# Patient Record
Sex: Female | Born: 1995 | Race: Black or African American | Hispanic: No | Marital: Single | Smoking: Current some day smoker
Health system: Southern US, Community
[De-identification: ages and names within clinical notes are randomized; demographics above are authoritative.]

## PROBLEM LIST (undated history)

## (undated) ENCOUNTER — Inpatient Hospital Stay (HOSPITAL_COMMUNITY): Payer: Self-pay

## (undated) ENCOUNTER — Inpatient Hospital Stay (HOSPITAL_COMMUNITY): Payer: Medicaid Other | Admitting: Obstetrics & Gynecology

## (undated) DIAGNOSIS — I10 Essential (primary) hypertension: Secondary | ICD-10-CM

## (undated) DIAGNOSIS — N189 Chronic kidney disease, unspecified: Secondary | ICD-10-CM

## (undated) HISTORY — PX: NEPHRECTOMY: SHX65

## (undated) HISTORY — PX: WISDOM TOOTH EXTRACTION: SHX21

---
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
## 2015-07-04 DIAGNOSIS — Z6830 Body mass index (BMI) 30.0-30.9, adult: Secondary | ICD-10-CM | POA: Insufficient documentation

## 2018-08-17 ENCOUNTER — Inpatient Hospital Stay (HOSPITAL_COMMUNITY): Payer: Medicaid Other

## 2018-08-17 ENCOUNTER — Other Ambulatory Visit: Payer: Self-pay

## 2018-08-17 ENCOUNTER — Inpatient Hospital Stay (HOSPITAL_COMMUNITY)
Admission: AD | Admit: 2018-08-17 | Discharge: 2018-08-17 | Disposition: A | Discharge status: Home / Self Care | Payer: Medicaid Other | Attending: Obstetrics & Gynecology | Admitting: Obstetrics & Gynecology

## 2018-08-17 ENCOUNTER — Encounter (HOSPITAL_COMMUNITY): Payer: Self-pay | Admitting: *Deleted

## 2018-08-17 DIAGNOSIS — O26831 Pregnancy related renal disease, first trimester: Secondary | ICD-10-CM | POA: Insufficient documentation

## 2018-08-17 DIAGNOSIS — O020 Blighted ovum and nonhydatidiform mole: Secondary | ICD-10-CM

## 2018-08-17 DIAGNOSIS — N189 Chronic kidney disease, unspecified: Secondary | ICD-10-CM | POA: Insufficient documentation

## 2018-08-17 DIAGNOSIS — Z3A01 Less than 8 weeks gestation of pregnancy: Secondary | ICD-10-CM | POA: Insufficient documentation

## 2018-08-17 DIAGNOSIS — O9989 Other specified diseases and conditions complicating pregnancy, childbirth and the puerperium: Secondary | ICD-10-CM | POA: Diagnosis not present

## 2018-08-17 DIAGNOSIS — I129 Hypertensive chronic kidney disease with stage 1 through stage 4 chronic kidney disease, or unspecified chronic kidney disease: Secondary | ICD-10-CM | POA: Diagnosis not present

## 2018-08-17 DIAGNOSIS — R1030 Lower abdominal pain, unspecified: Secondary | ICD-10-CM | POA: Diagnosis present

## 2018-08-17 DIAGNOSIS — Z9889 Other specified postprocedural states: Secondary | ICD-10-CM | POA: Insufficient documentation

## 2018-08-17 DIAGNOSIS — F17210 Nicotine dependence, cigarettes, uncomplicated: Secondary | ICD-10-CM | POA: Insufficient documentation

## 2018-08-17 DIAGNOSIS — R109 Unspecified abdominal pain: Secondary | ICD-10-CM | POA: Diagnosis not present

## 2018-08-17 DIAGNOSIS — Z905 Acquired absence of kidney: Secondary | ICD-10-CM | POA: Insufficient documentation

## 2018-08-17 DIAGNOSIS — Z8249 Family history of ischemic heart disease and other diseases of the circulatory system: Secondary | ICD-10-CM | POA: Insufficient documentation

## 2018-08-17 DIAGNOSIS — O26891 Other specified pregnancy related conditions, first trimester: Secondary | ICD-10-CM

## 2018-08-17 DIAGNOSIS — Z886 Allergy status to analgesic agent status: Secondary | ICD-10-CM | POA: Insufficient documentation

## 2018-08-17 DIAGNOSIS — O99331 Smoking (tobacco) complicating pregnancy, first trimester: Secondary | ICD-10-CM | POA: Diagnosis not present

## 2018-08-17 DIAGNOSIS — O219 Vomiting of pregnancy, unspecified: Secondary | ICD-10-CM | POA: Diagnosis not present

## 2018-08-17 HISTORY — DX: Chronic kidney disease, unspecified: N18.9

## 2018-08-17 HISTORY — DX: Essential (primary) hypertension: I10

## 2018-08-17 LAB — URINALYSIS, ROUTINE W REFLEX MICROSCOPIC
Bilirubin Urine: NEGATIVE
Glucose, UA: NEGATIVE mg/dL
Hgb urine dipstick: NEGATIVE
Ketones, ur: NEGATIVE mg/dL
Leukocytes, UA: NEGATIVE
Nitrite: NEGATIVE
Protein, ur: NEGATIVE mg/dL
Specific Gravity, Urine: 1.018 (ref 1.005–1.030)
pH: 6 (ref 5.0–8.0)

## 2018-08-17 LAB — CBC WITH DIFFERENTIAL/PLATELET
Basophils Absolute: 0 10*3/uL (ref 0.0–0.1)
Basophils Relative: 0 %
EOS ABS: 0.1 10*3/uL (ref 0.0–0.5)
Eosinophils Relative: 1 %
HCT: 38.4 % (ref 36.0–46.0)
Hemoglobin: 12.7 g/dL (ref 12.0–15.0)
LYMPHS ABS: 2.8 10*3/uL (ref 0.7–4.0)
Lymphocytes Relative: 29 %
MCH: 30.5 pg (ref 26.0–34.0)
MCHC: 33.1 g/dL (ref 30.0–36.0)
MCV: 92.1 fL (ref 80.0–100.0)
Monocytes Absolute: 0.5 10*3/uL (ref 0.1–1.0)
Monocytes Relative: 5 %
Neutro Abs: 6.4 10*3/uL (ref 1.7–7.7)
Neutrophils Relative %: 65 %
Platelets: 189 10*3/uL (ref 150–400)
RBC: 4.17 MIL/uL (ref 3.87–5.11)
RDW: 13.2 % (ref 11.5–15.5)
WBC: 9.8 10*3/uL (ref 4.0–10.5)
nRBC: 0 % (ref 0.0–0.2)

## 2018-08-17 LAB — TYPE AND SCREEN
ABO/RH(D): O POS
Antibody Screen: NEGATIVE

## 2018-08-17 LAB — ABO/RH: ABO/RH(D): O POS

## 2018-08-17 LAB — POCT PREGNANCY, URINE: PREG TEST UR: POSITIVE — AB

## 2018-08-17 LAB — HCG, QUANTITATIVE, PREGNANCY: hCG, Beta Chain, Quant, S: 4525 m[IU]/mL — ABNORMAL HIGH (ref <5)

## 2018-08-17 NOTE — MAU Provider Note (Signed)
History     CSN: [id number]  Arrival date and time: 08/17/18 1518   First Provider Initiated Contact with Patient 08/17/18 1728      Chief Complaint  Patient presents with  . Abdominal Pain  . Emesis  . Possible Pregnancy   Kelli Scott is a 22 y.o. G1P0 at [redacted]w[redacted]d by LMP who presents to MAU with complaints of abdominal pain and vomiting. She reports abdominal pain has been occurring for the past 1-2 days, describes the abdominal pain as lower abdominal cramping, rates pain 2/10- has not taken any medication for abdominal pain- "does not like taking medicine". She denies vaginal bleeding or vaginal discharge. She reports 1 occurrence of emesis this afternoon but denies continued nausea.    OB History    Gravida  1   Para      Term      Preterm      AB      Living        SAB      TAB      Ectopic      Multiple      Live Births              Past Medical History:  Diagnosis Date  . Chronic kidney disease   . Hypertension     Past Surgical History:  Procedure Laterality Date  . NEPHRECTOMY Left   . WISDOM TOOTH EXTRACTION Bilateral     Family History  Problem Relation Age of Onset  . Hypertension Father   . Hypertension Brother   . Stroke Brother     Social History   Tobacco Use  . Smoking status: Current Every Day Smoker    Types: Cigarettes  . Smokeless tobacco: Never Used  Substance Use Topics  . Alcohol use: Not Currently  . Drug use: Not Currently    Allergies:  Allergies  Allergen Reactions  . Ibuprofen     No medications prior to admission.    Review of Systems  Constitutional: Negative.   Respiratory: Negative.   Cardiovascular: Negative.   Gastrointestinal: Positive for abdominal pain and vomiting. Negative for constipation, diarrhea and nausea.  Genitourinary: Negative.   Neurological: Negative.    Physical Exam   Patient Vitals for the past 24 hrs:  BP Temp Temp src Pulse Resp SpO2 Weight  08/17/18 1935 (!) 139/95  - - - - - -  08/17/18 1543 (!) 145/93 98.8 F (37.1 C) Oral 89 17 100 % 73.7 kg   Physical Exam  Nursing note and vitals reviewed. Constitutional: She is oriented to person, place, and time. She appears well-developed and well-nourished. No distress.  Cardiovascular: Normal rate, regular rhythm and normal heart sounds.  Respiratory: Effort normal and breath sounds normal. No respiratory distress. She has no wheezes. She has no rales.  GI: Soft. Bowel sounds are normal. She exhibits no distension. There is no abdominal tenderness. There is no rebound.  Musculoskeletal: Normal range of motion.        General: No edema.  Neurological: She is alert and oriented to person, place, and time.    MAU Course  Procedures  MDM Orders Placed This Encounter  Procedures  . US OB LESS THAN 14 WEEKS WITH OB TRANSVAGINAL  . Urinalysis, Routine w reflex microscopic  . hCG, quantitative, pregnancy  . CBC with Differential/Platelet  . Pregnancy, urine POC  . Type and screen [facility]  . ABO/Rh   Results for orders placed or performed during  the hospital encounter of 08/17/18 (from the past 24 hour(s))  Urinalysis, Routine w reflex microscopic     Status: Abnormal   Collection Time: 08/17/18  4:07 PM  Result Value Ref Range   Color, Urine STRAW (A) YELLOW   APPearance CLEAR CLEAR   Specific Gravity, Urine 1.018 1.005 - 1.030   pH 6.0 5.0 - 8.0   Glucose, UA NEGATIVE NEGATIVE mg/dL   Hgb urine dipstick NEGATIVE NEGATIVE   Bilirubin Urine NEGATIVE NEGATIVE   Ketones, ur NEGATIVE NEGATIVE mg/dL   Protein, ur NEGATIVE NEGATIVE mg/dL   Nitrite NEGATIVE NEGATIVE   Leukocytes, UA NEGATIVE NEGATIVE  Pregnancy, urine POC     Status: Abnormal   Collection Time: 08/17/18  4:12 PM  Result Value Ref Range   Preg Test, Ur POSITIVE (A) NEGATIVE  hCG, quantitative, pregnancy     Status: Abnormal   Collection Time: 08/17/18  4:30 PM  Result Value Ref Range   hCG, Beta Chain, Quant, S  4,525 (H) <5 mIU/mL  CBC with Differential/Platelet     Status: None   Collection Time: 08/17/18  4:30 PM  Result Value Ref Range   WBC 9.8 4.0 - 10.5 K/uL   RBC 4.17 3.87 - 5.11 MIL/uL   Hemoglobin 12.7 12.0 - 15.0 g/dL   HCT 91.438.4 78.236.0 - 95.646.0 %   MCV 92.1 80.0 - 100.0 fL   MCH 30.5 26.0 - 34.0 pg   MCHC 33.1 30.0 - 36.0 g/dL   RDW 21.313.2 08.611.5 - 57.815.5 %   Platelets 189 150 - 400 K/uL   nRBC 0.0 0.0 - 0.2 %   Neutrophils Relative % 65 %   Neutro Abs 6.4 1.7 - 7.7 K/uL   Lymphocytes Relative 29 %   Lymphs Abs 2.8 0.7 - 4.0 K/uL   Monocytes Relative 5 %   Monocytes Absolute 0.5 0.1 - 1.0 K/uL   Eosinophils Relative 1 %   Eosinophils Absolute 0.1 0.0 - 0.5 K/uL   Basophils Relative 0 %   Basophils Absolute 0.0 0.0 - 0.1 K/uL  Type and screen [facility]     Status: None   Collection Time: 08/17/18  4:30 PM  Result Value Ref Range   ABO/RH(D) O POS    Antibody Screen NEG    Sample Expiration      08/20/2018 Performed at [facility], [address] [place], [place] [address]    [place] Ob Less Than 14 Weeks With Ob Transvaginal  Result Date: 08/17/2018 CLINICAL DATA:  22 year old female with abdominal pain and nausea in the 1st trimester of pregnancy. Quantitative beta HCG 4,525. Estimated gestational age by LMP 4 weeks 3 days. EXAM: OBSTETRIC <14 WK US AND TRANSVAGINAL OB US TECHNIQUE: Both transabdominal and transvaginal ultrasound examinations were performed for complete evaluation of the gestation as well as the maternal uterus, adnexal regions, and pelvic cul-de-sac. Transvaginal technique was performed to assess early pregnancy. COMPARISON:  None. FINDINGS: Intrauterine gestational sac: Single (image 22) Yolk sac:  Probable yolk sac (image 23) Embryo:  Not visible Cardiac Activity: Not evident MSD: 2.9 mm   4 w   6 d Subchorionic hemorrhage:  None visualized. Maternal uterus/adnexae: Probable right ovary corpus luteum (image 32). The right ovary is 3.1 x 2.2 x  1.9 centimeters. The left ovary is 2.5 x 1.7 x 1.5 centimeters and appears normal. Trace simple appearing pelvic free fluid (image 34). IMPRESSION: 1. Probable early intrauterine gestational sac with faint yolk sac, but no fetal pole, or  cardiac activity yet visualized. Recommend follow-up quantitative B-HCG levels and follow-up US in 14 days to confirm and assess viability. This recommendation follows SRU consensus guidelines: Diagnostic Criteria for Nonviable Pregnancy Early in the First Trimester. Malva Limes Engl J Med 2013; 409:8119-14; 369:1443-51. 2. No subchorionic hemorrhage. Probable right ovary corpus luteum. Trace simple appearing pelvic free fluid. Electronically Signed   By: Odessa FlemingH  Hall M.D.   On: 08/17/2018 19:14   Discussed results of labs and US with patient. Educated and discussed follow up with patient. Educated on reasons to return to MAU. Patient stable at time of discharge.   Assessment and Plan   1. Empty gestational sac with ongoing pregnancy   2. Abdominal pain in pregnancy, first trimester   3. Abdominal pain during pregnancy in first trimester   4. [redacted] weeks gestation of pregnancy    Discharge home Follow up HCG in 48 hours  Return to MAU as needed   Follow-up Information    [facility] on 08[facility]2018.   Specialty:  Obstetrics and Gynecology Contact information: [address] [place] [id number] [phone]          Sharyon CableVeronica C Emit Kuenzel CNM 08/17/2018, 7:47 PM

## 2018-08-17 NOTE — MAU Note (Signed)
Having abd pain, vomiting.  Hasn't seen period.  +HPT on Sat.

## 2018-08-20 ENCOUNTER — Ambulatory Visit (INDEPENDENT_AMBULATORY_CARE_PROVIDER_SITE_OTHER): Payer: Self-pay

## 2018-08-20 DIAGNOSIS — O3680X0 Pregnancy with inconclusive fetal viability, not applicable or unspecified: Secondary | ICD-10-CM

## 2018-08-20 LAB — HCG, QUANTITATIVE, PREGNANCY: hCG, Beta Chain, Quant, S: 11314 m[IU]/mL — ABNORMAL HIGH (ref <5)

## 2018-08-20 NOTE — Progress Notes (Signed)
Agree with A & P. 

## 2018-08-20 NOTE — Progress Notes (Signed)
Pt here today for stat beta lab for risk for ectopic pregnancy.  Pt reports that she is no longer having the pain and she is not having vaginal bleeding.  Pt stated that she did not know that she had to be here for two hours and needs to go back.  Notified Dr. Vergie LivingPickens that pt needed to go to work and could noy  stay for results.  Provider stated that it was okay that she went back to work.  Pt explained that she can go back to work and to please be looking out for our phone call.  Pt stated understanding with no further questions.   Notifed Dr. Alysia PennaErvin of pt's results.  Provider recommendation to have f/u US scheduled asap.  OB US scheduled 08/21/18 @ 1300.  Pt notified of results, provider recommendation, and US appt.  Pt stated understanding with no further questions.

## 2018-08-21 ENCOUNTER — Encounter (HOSPITAL_COMMUNITY): Payer: Self-pay

## 2018-08-21 ENCOUNTER — Ambulatory Visit (HOSPITAL_COMMUNITY)
Admission: RE | Admit: 2018-08-21 | Discharge: 2018-08-21 | Disposition: A | Discharge status: Home / Self Care | Payer: Medicaid Other | Source: Ambulatory Visit | Attending: Obstetrics and Gynecology | Admitting: Obstetrics and Gynecology

## 2018-08-21 DIAGNOSIS — O3680X0 Pregnancy with inconclusive fetal viability, not applicable or unspecified: Secondary | ICD-10-CM

## 2018-08-21 DIAGNOSIS — Z3A01 Less than 8 weeks gestation of pregnancy: Secondary | ICD-10-CM | POA: Insufficient documentation

## 2018-08-24 ENCOUNTER — Telehealth: Payer: Self-pay | Admitting: *Deleted

## 2018-08-24 DIAGNOSIS — O3680X0 Pregnancy with inconclusive fetal viability, not applicable or unspecified: Secondary | ICD-10-CM

## 2018-08-24 NOTE — Telephone Encounter (Signed)
Ultrasound scheduled for 09/01/18 @ 1300.  Pt unable to make this time and therefore will call the scheduler to reschedule her appointment.  Pt verbalized understanding.

## 2018-08-24 NOTE — Telephone Encounter (Signed)
-----   Message from Hermina StaggersMichael L Ervin, MD sent at 08/24/2018 10:24 AM EST ----- F/U U/S in 10 -14 days from 08/21/18 Thanks Casimiro NeedleMichael

## 2018-08-24 NOTE — Telephone Encounter (Signed)
Pt left VM message while office was closed (12/27). She requested results of ultrasound performed earlier the same Kelli Scott.

## 2018-09-01 ENCOUNTER — Ambulatory Visit (HOSPITAL_COMMUNITY): Payer: Self-pay

## 2018-09-02 ENCOUNTER — Ambulatory Visit (INDEPENDENT_AMBULATORY_CARE_PROVIDER_SITE_OTHER): Payer: Medicaid Other | Admitting: General Practice

## 2018-09-02 ENCOUNTER — Encounter: Payer: Self-pay | Admitting: Family Medicine

## 2018-09-02 ENCOUNTER — Ambulatory Visit (HOSPITAL_COMMUNITY)
Admission: RE | Admit: 2018-09-02 | Discharge: 2018-09-02 | Disposition: A | Discharge status: Home / Self Care | Payer: Medicaid Other | Source: Ambulatory Visit | Attending: Obstetrics and Gynecology | Admitting: Obstetrics and Gynecology

## 2018-09-02 VITALS — BP 131/82 | HR 73

## 2018-09-02 DIAGNOSIS — O3680X0 Pregnancy with inconclusive fetal viability, not applicable or unspecified: Secondary | ICD-10-CM | POA: Diagnosis not present

## 2018-09-02 DIAGNOSIS — Z712 Person consulting for explanation of examination or test findings: Secondary | ICD-10-CM

## 2018-09-02 NOTE — Progress Notes (Signed)
I have reviewed this chart and agree with the RN/CMA assessment and management.    K. Meryl Davis, M.D. Attending [facility]   

## 2018-09-02 NOTE — Progress Notes (Signed)
Patient presents to office today for viability ultrasound results. Reviewed results with Dr Earlene Plater who finds living IUP, patient should begin prenatal care.   Informed patient of results, provided pictures, and reviewed dating. Patient verbalized understanding & reports this is her first pregnancy and she does have HTN. Patient reports history of kidney surgery as well. Patient states she was on lisinopril back in August/September but her doctor took her off that medication and changed it due to potential of pregnancy. Patient states she couldn't afford new medication & hasn't been taking anything for her BP since November when she moved. BP is WNL today. Patient will follow up at [facility] office for prenatal care.   Chase Caller RN BSN 09/02/18

## 2018-09-17 ENCOUNTER — Other Ambulatory Visit (HOSPITAL_COMMUNITY)
Admission: RE | Admit: 2018-09-17 | Discharge: 2018-09-17 | Disposition: A | Discharge status: Home / Self Care | Payer: Medicaid Other | Source: Ambulatory Visit | Attending: Family Medicine | Admitting: Family Medicine

## 2018-09-17 ENCOUNTER — Encounter: Payer: Self-pay | Admitting: Family Medicine

## 2018-09-17 ENCOUNTER — Ambulatory Visit (INDEPENDENT_AMBULATORY_CARE_PROVIDER_SITE_OTHER): Payer: Medicaid Other | Admitting: Family Medicine

## 2018-09-17 VITALS — BP 125/85 | HR 85 | Ht 60.0 in | Wt 161.0 lb

## 2018-09-17 DIAGNOSIS — Z3401 Encounter for supervision of normal first pregnancy, first trimester: Secondary | ICD-10-CM

## 2018-09-17 DIAGNOSIS — Z3400 Encounter for supervision of normal first pregnancy, unspecified trimester: Secondary | ICD-10-CM

## 2018-09-17 DIAGNOSIS — O10919 Unspecified pre-existing hypertension complicating pregnancy, unspecified trimester: Secondary | ICD-10-CM | POA: Insufficient documentation

## 2018-09-17 DIAGNOSIS — O10911 Unspecified pre-existing hypertension complicating pregnancy, first trimester: Secondary | ICD-10-CM

## 2018-09-17 DIAGNOSIS — N289 Disorder of kidney and ureter, unspecified: Secondary | ICD-10-CM

## 2018-09-17 DIAGNOSIS — O0990 Supervision of high risk pregnancy, unspecified, unspecified trimester: Secondary | ICD-10-CM | POA: Insufficient documentation

## 2018-09-17 LAB — POCT URINALYSIS DIPSTICK OB
Blood, UA: NEGATIVE
GLUCOSE, UA: NEGATIVE
LEUKOCYTES UA: NEGATIVE
Spec Grav, UA: 1.025 (ref 1.010–1.025)
pH, UA: 6.5 (ref 5.0–8.0)

## 2018-09-17 MED ORDER — DOXYLAMINE-PYRIDOXINE 10-10 MG PO TBEC: 2.0000 | DELAYED_RELEASE_TABLET | Freq: Every day | ORAL | 5 refills | Status: DC

## 2018-09-17 NOTE — Progress Notes (Signed)
DATING AND VIABILITY SONOGRAM   Kelli Scott is a 23 y.o. year old G1P0 with LMP Patient's last menstrual period was 07/17/2018. which would correlate to  [redacted]w[redacted]d weeks gestation.  She has regular menstrual cycles.   She is here today for a confirmatory initial sonogram.    GESTATION: [place]     FETAL ACTIVITY:          Heart rate         158          The fetus is active.     GESTATIONAL AGE AND  BIOMETRICS:  Gestational criteria: Estimated Date of Delivery: 04/23/19 by LMP now at [redacted]w[redacted]d  Previous Scans:1      CROWN RUMP LENGTH           2.43 cm        9-1 weeks                                                                               AVERAGE EGA(BY THIS SCAN):  9-1weeks  WORKING EDD( LMP ):04-23-2019     TECHNICIAN COMMENTS:  Patient informed that the ultrasound is considered a limited obstetric ultrasound and is not intended to be a complete ultrasound exam. Patient also informed that the ultrasound is not being completed with the intent of assessing for fetal or placental anomalies or any pelvic abnormalities. Explained that the purpose of today's ultrasound is to assess for fetal heart rate. Patient acknowledges the purpose of the exam and the limitations of the study.      Armandina Stammer 09/17/2018 4:06 PM

## 2018-09-17 NOTE — Progress Notes (Signed)
Subjective:  Kelli Scott is a G1P0 3078w6d being seen today for her first obstetrical visit.  Her obstetrical history is significant for hypertension, decreased kidney function. She had a left nephrectomy (renal duplication) in 2011. Had testing -  55% function of left kidney. Normal right kidney function. FOB is patient's boyfriend. They were not actively preventing the pregnancy. Patient does intend to breast feed. Pregnancy history fully reviewed.  Patient reports nausea.  BP 125/85   Pulse 85   Ht 5' (1.524 m)   Wt 161 lb 0.6 oz (73 kg)   LMP 07/17/2018   BMI 31.45 kg/m   HISTORY: OB History  Gravida Para Term Preterm AB Living  1            SAB TAB Ectopic Multiple Live Births               # Outcome Date GA Lbr Len/2nd Weight Sex Delivery Anes PTL Lv  1 Current             Past Medical History:  Diagnosis Date  . Chronic kidney disease   . Hypertension     Past Surgical History:  Procedure Laterality Date  . NEPHRECTOMY Left   . WISDOM TOOTH EXTRACTION Bilateral     Family History  Problem Relation Age of Onset  . Hypertension Father   . Hypertension Brother   . Stroke Brother      Exam  BP 125/85   Pulse 85   Ht 5' (1.524 m)   Wt 161 lb 0.6 oz (73 kg)   LMP 07/17/2018   BMI 31.45 kg/m   CONSTITUTIONAL: Well-developed, well-nourished female in no acute distress.  HENT:  Normocephalic, atraumatic, External right and left ear normal. Oropharynx is clear and moist EYES: Conjunctivae and EOM are normal. Pupils are equal, round, and reactive to light. No scleral icterus.  NECK: Normal range of motion, supple, no masses.  Normal thyroid.  CARDIOVASCULAR: Normal heart rate noted, regular rhythm RESPIRATORY: Clear to auscultation bilaterally. Effort and breath sounds normal, no problems with respiration noted. BREASTS: declined ABDOMEN: Soft, normal bowel sounds, no distention noted.  No tenderness, rebound or guarding.  PELVIC: Normal appearing external  genitalia; normal appearing vaginal mucosa and cervix. No abnormal discharge noted. Normal uterine size, no other palpable masses, no uterine or adnexal tenderness. MUSCULOSKELETAL: Normal range of motion. No tenderness.  No cyanosis, clubbing, or edema.  2+ distal pulses. SKIN: Skin is warm and dry. No rash noted. Not diaphoretic. No erythema. No pallor. NEUROLOGIC: Alert and oriented to person, place, and time. Normal reflexes, muscle tone coordination. No cranial nerve deficit noted. PSYCHIATRIC: Normal mood and affect. Normal behavior. Normal judgment and thought content.    Assessment:    Pregnancy: G1P0 Patient Active Problem List   Diagnosis Date Noted  . Supervision of normal first pregnancy, antepartum 09/17/2018      Plan:   1. Supervision of normal first pregnancy, antepartum Due date by LMP, c/w US today. PAP done in 04/2018 - normal results in Care Everywhere.  Discussed nature of practice - multiple physicians, midwifes, fellows, etc.  Desires panorama for Down syndrome screening. - [facility] AMB BABYSCRIPTS OPT IN - Culture, OB Urine - Flu Vaccine QUAD 36+ mos IM - Obstetric Panel, Including HIV - SMN1 COPY NUMBER ANALYSIS (SMA Carrier Screen) - Inheritest Society Guided - GC/Chlamydia probe amp ([place])[facility] - Comprehensive metabolic panel - Protein / creatinine ratio, urine - POC Urinalysis Dipstick OB  2. Chronic  hypertension affecting pregnancy CMP, CBC, UP:C  ASA 81mg  at 12 weeks Serial [place] after 20 weeks Antenatal testing at 32 weeks Delivery at 39 weeks  3. Function kidney decreased Check Cr.   Problem list reviewed and updated. 75% of 45 min visit spent on counseling and coordination of care.     Levie Heritage 09/17/2018

## 2018-09-18 LAB — PROTEIN / CREATININE RATIO, URINE
Creatinine, Urine: 377.6 mg/dL
Protein, Ur: 57 mg/dL
Protein/Creat Ratio: 151 mg/g creat (ref 0–200)

## 2018-09-20 LAB — URINE CULTURE, OB REFLEX

## 2018-09-20 LAB — CULTURE, OB URINE

## 2018-09-21 ENCOUNTER — Telehealth: Payer: Self-pay

## 2018-09-21 ENCOUNTER — Other Ambulatory Visit: Payer: Self-pay | Admitting: Family Medicine

## 2018-09-21 LAB — GC/CHLAMYDIA PROBE AMP (~~LOC~~) NOT AT ARMC
Chlamydia: NEGATIVE
Neisseria Gonorrhea: NEGATIVE

## 2018-09-21 MED ORDER — CEPHALEXIN 500 MG PO CAPS: 500.0000 mg | ORAL_CAPSULE | Freq: Four times a day (QID) | ORAL | 0 refills | Status: DC

## 2018-09-21 NOTE — Telephone Encounter (Signed)
-----   Message from Levie Heritage, DO sent at 09/21/2018  7:56 AM EST ----- UTI - keflex 500mg  4x per day prescribed for 7 days.

## 2018-09-21 NOTE — Telephone Encounter (Signed)
Called pt with positive UTI results. Understanding was voiced.

## 2018-10-02 LAB — OBSTETRIC PANEL, INCLUDING HIV
Antibody Screen: NEGATIVE
Basophils Absolute: 0 10*3/uL (ref 0.0–0.2)
Basos: 0 %
EOS (ABSOLUTE): 0.1 10*3/uL (ref 0.0–0.4)
Eos: 1 %
HEP B S AG: NEGATIVE
HIV Screen 4th Generation wRfx: NONREACTIVE
Hematocrit: 37.7 % (ref 34.0–46.6)
Hemoglobin: 12.9 g/dL (ref 11.1–15.9)
IMMATURE GRANS (ABS): 0 10*3/uL (ref 0.0–0.1)
Immature Granulocytes: 0 %
Lymphocytes Absolute: 2 10*3/uL (ref 0.7–3.1)
Lymphs: 18 %
MCH: 30.1 pg (ref 26.6–33.0)
MCHC: 34.2 g/dL (ref 31.5–35.7)
MCV: 88 fL (ref 79–97)
Monocytes Absolute: 1.2 10*3/uL — ABNORMAL HIGH (ref 0.1–0.9)
Monocytes: 11 %
Neutrophils Absolute: 7.3 10*3/uL — ABNORMAL HIGH (ref 1.4–7.0)
Neutrophils: 70 %
PLATELETS: 192 10*3/uL (ref 150–450)
RBC: 4.28 x10E6/uL (ref 3.77–5.28)
RDW: 12.7 % (ref 11.7–15.4)
RPR Ser Ql: NONREACTIVE
Rh Factor: POSITIVE
Rubella Antibodies, IGG: 1.25 index (ref >0.99)
WBC: 10.6 10*3/uL (ref 3.4–10.8)

## 2018-10-02 LAB — COMPREHENSIVE METABOLIC PANEL
ALT: 9 IU/L (ref 0–32)
AST: 10 IU/L (ref 0–40)
Albumin/Globulin Ratio: 2 (ref 1.2–2.2)
Albumin: 4.4 g/dL (ref 3.9–5.0)
Alkaline Phosphatase: 47 IU/L (ref 39–117)
BUN/Creatinine Ratio: 16 (ref 9–23)
BUN: 10 mg/dL (ref 6–20)
Bilirubin Total: 0.3 mg/dL (ref 0.0–1.2)
CALCIUM: 9.6 mg/dL (ref 8.7–10.2)
CO2: 19 mmol/L — ABNORMAL LOW (ref 20–29)
Chloride: 103 mmol/L (ref 96–106)
Creatinine, Ser: 0.63 mg/dL (ref 0.57–1.00)
GFR calc Af Amer: 147 mL/min/{1.73_m2} (ref >59)
GFR calc non Af Amer: 128 mL/min/{1.73_m2} (ref >59)
Globulin, Total: 2.2 g/dL (ref 1.5–4.5)
Glucose: 77 mg/dL (ref 65–99)
Potassium: 3.6 mmol/L (ref 3.5–5.2)
Sodium: 136 mmol/L (ref 134–144)
Total Protein: 6.6 g/dL (ref 6.0–8.5)

## 2018-10-02 LAB — INHERITEST SOCIETY GUIDED

## 2018-10-21 ENCOUNTER — Ambulatory Visit (INDEPENDENT_AMBULATORY_CARE_PROVIDER_SITE_OTHER): Payer: Medicaid Other | Admitting: Obstetrics & Gynecology

## 2018-10-21 ENCOUNTER — Encounter: Payer: Self-pay | Admitting: Obstetrics & Gynecology

## 2018-10-21 VITALS — BP 139/103 | HR 97

## 2018-10-21 DIAGNOSIS — Z6830 Body mass index (BMI) 30.0-30.9, adult: Secondary | ICD-10-CM

## 2018-10-21 DIAGNOSIS — I10 Essential (primary) hypertension: Secondary | ICD-10-CM

## 2018-10-21 DIAGNOSIS — Z3402 Encounter for supervision of normal first pregnancy, second trimester: Secondary | ICD-10-CM

## 2018-10-21 DIAGNOSIS — Z3400 Encounter for supervision of normal first pregnancy, unspecified trimester: Secondary | ICD-10-CM

## 2018-10-21 DIAGNOSIS — O10919 Unspecified pre-existing hypertension complicating pregnancy, unspecified trimester: Secondary | ICD-10-CM

## 2018-10-21 DIAGNOSIS — Z3A13 13 weeks gestation of pregnancy: Secondary | ICD-10-CM

## 2018-10-21 DIAGNOSIS — N289 Disorder of kidney and ureter, unspecified: Secondary | ICD-10-CM

## 2018-10-21 NOTE — Progress Notes (Signed)
   PRENATAL VISIT NOTE  Subjective:  Kelli Scott is a 23 y.o. G1P0 at [redacted]w[redacted]d being seen today for ongoing prenatal care.  She is currently monitored for the following issues for this high-risk pregnancy and has Supervision of normal first pregnancy, antepartum; BMI 30.0-30.9,adult; Function kidney decreased; Eczema; Hypertension, essential; and Chronic hypertension affecting pregnancy on their problem list.  Patient reports nausea.  Contractions: Not present. Vag. Bleeding: None.  Movement: Present. Denies leaking of fluid.   The following portions of the patient's history were reviewed and updated as appropriate: allergies, current medications, past family history, past medical history, past social history, past surgical history and problem list. Problem list updated.  Objective:   Vitals:   10/21/18 1547 10/21/18 1549  BP: (!) 143/95 (!) 139/103  Pulse: 98 97    Fetal Status:     Movement: Present     General:  Alert, oriented and cooperative. Patient is in no acute distress.  Skin: Skin is warm and dry. No rash noted.   Cardiovascular: Normal heart rate noted  Respiratory: Normal respiratory effort, no problems with respiration noted  Abdomen: Soft, gravid, appropriate for gestational age.  Pain/Pressure: Absent     Pelvic: Cervical exam deferred        Extremities: Normal range of motion.  Edema: None  Mental Status: Normal mood and affect. Normal behavior. Normal judgment and thought content.   Assessment and Plan:  Pregnancy: G1P0 at [redacted]w[redacted]d  1. Supervision of normal first pregnancy, antepartum Anatomy scan at 18weeks   2. Hypertension, essential Pt was on BP meds prev but, stopped on her own   3. Chronic hypertension affecting pregnancy Reviewed with pt starting baby ASA Pt to f/u in 2 weeks with log of her BPs. She will take at work    [facility] nephrologist.  4. Function kidney decreased Pt referred to nephrology. She reports that she does not have a  nephrologist.    Preterm labor symptoms and general obstetric precautions including but not limited to vaginal bleeding, contractions, leaking of fluid and fetal movement were reviewed in detail with the patient. Please refer to After Visit Summary for other counseling recommendations.  Return in about 4 weeks (around 11/18/2018).  No future appointments.  Willodean Rosenthal, MD

## 2018-10-21 NOTE — Patient Instructions (Signed)
Hypertension During Pregnancy ° °Hypertension, commonly called high blood pressure, is when the force of blood pumping through your arteries is too strong. Arteries are blood vessels that carry blood from the heart throughout the body. Hypertension during pregnancy can cause problems for you and your baby. Your baby may be born early (prematurely) or may not weigh as much as he or she should at birth. Very bad cases of hypertension during pregnancy can be life-threatening. °Different types of hypertension can occur during pregnancy. These include: °· Chronic hypertension. This happens when: °? You have hypertension before pregnancy and it continues during pregnancy. °? You develop hypertension before you are [redacted] weeks pregnant, and it continues during pregnancy. °· Gestational hypertension. This is hypertension that develops after the 20th week of pregnancy. °· Preeclampsia, also called toxemia of pregnancy. This is a very serious type of hypertension that develops during pregnancy. It can be very dangerous for you and your baby. °? In rare cases, you may develop preeclampsia after giving birth (postpartum preeclampsia). This usually occurs within 48 hours after childbirth but may occur up to 6 weeks after giving birth. °Gestational hypertension and preeclampsia usually go away within 6 weeks after your baby is born. Women who have hypertension during pregnancy have a greater chance of developing hypertension later in life or during future pregnancies. °What are the causes? °The exact cause of hypertension during pregnancy is not known. °What increases the risk? °There are certain factors that make it more likely for you to develop hypertension during pregnancy. These include: °· Having hypertension during a previous pregnancy or prior to pregnancy. °· Being overweight. °· Being age 35 or older. °· Being pregnant for the first time. °· Being pregnant with more than one baby. °· Becoming pregnant using fertilization  methods such as IVF (in vitro fertilization). °· Having diabetes, kidney problems, or systemic lupus erythematosus. °· Having a family history of hypertension. °What are the signs or symptoms? °Chronic hypertension and gestational hypertension rarely cause symptoms. Preeclampsia causes symptoms, which may include: °· Increased protein in your urine. Your health care provider will check for this at every visit before you give birth (prenatal visit). °· Severe headaches. °· Sudden weight gain. °· Swelling of the hands, face, legs, and feet. °· Nausea and vomiting. °· Vision problems, such as blurred or double vision. °· Numbness in the face, arms, legs, and feet. °· Dizziness. °· Slurred speech. °· Sensitivity to bright lights. °· Abdominal pain. °· Convulsions or seizures. °How is this diagnosed? °You may be diagnosed with hypertension during a routine prenatal exam. At each prenatal visit, you may: °· Have a urine test to check for high amounts of protein in your urine. °· Have your blood pressure checked. A blood pressure reading is given as two numbers, such as "120 over 80" (or 120/80). The first ("top") number is a measure of the pressure in your arteries when your heart beats (systolic pressure). The second ("bottom") number is a measure of the pressure in your arteries as your heart relaxes between beats (diastolic pressure). Blood pressure is measured in a unit called mm Hg. For most women, a normal blood pressure reading is: °? Systolic: below 120. °? Diastolic: below 80. °The type of hypertension that you are diagnosed with depends on your test results and when your symptoms developed. °· Chronic hypertension is usually diagnosed before 20 weeks of pregnancy. °· Gestational hypertension is usually diagnosed after 20 weeks of pregnancy. °· Hypertension with high amounts of protein in   the urine is diagnosed as preeclampsia. °· Blood pressure measurements that stay above 160 systolic, or above 110 diastolic,  are signs of severe preeclampsia. °How is this treated? °Treatment for hypertension during pregnancy varies depending on the type of hypertension you have and how serious it is. °· If you take medicines called ACE inhibitors to treat chronic hypertension, you may need to switch medicines. ACE inhibitors should not be taken during pregnancy. °· If you have gestational hypertension, you may need to take blood pressure medicine. °· If you are at risk for preeclampsia, your health care provider may recommend that you take a low-dose aspirin during your pregnancy. °· If you have severe preeclampsia, you may need to be hospitalized so you and your baby can be monitored closely. You may also need to take medicine (magnesium sulfate) to prevent seizures and to lower blood pressure. This medicine may be given as an injection or through an IV. °· In some cases, if your condition gets worse, you may need to deliver your baby early. °Follow these instructions at home: °Eating and drinking ° °· Drink enough fluid to keep your urine pale yellow. °· Avoid caffeine. °Lifestyle °· Do not use any products that contain nicotine or tobacco, such as cigarettes and e-cigarettes. If you need help quitting, ask your health care provider. °· Do not use alcohol or drugs. °· Avoid stress as much as possible. Rest and get plenty of sleep. °General instructions °· Take over-the-counter and prescription medicines only as told by your health care provider. °· While lying down, lie on your left side. This keeps pressure off your major blood vessels. °· While sitting or lying down, raise (elevate) your feet. Try putting some pillows under your lower legs. °· Exercise regularly. Ask your health care provider what kinds of exercise are best for you. °· Keep all prenatal and follow-up visits as told by your health care provider. This is important. °Contact a health care provider if: °· You have symptoms that your health care provider told you may  require more treatment or monitoring, such as: °? Nausea or vomiting. °? Headache. °Get help right away if you have: °· Severe abdominal pain that does not get better with treatment. °· A severe headache that does not get better. °· Vomiting that does not get better. °· Sudden, rapid weight gain. °· Sudden swelling in your hands, ankles, or face. °· Vaginal bleeding. °· Blood in your urine. °· Fewer movements from your baby than usual. °· Blurred or double vision. °· Muscle twitching or sudden muscle tightening (spasms). °· Shortness of breath. °· Blue fingernails or lips. °Summary °· Hypertension, commonly called high blood pressure, is when the force of blood pumping through your arteries is too strong. °· Hypertension during pregnancy can cause problems for you and your baby. °· Treatment for hypertension during pregnancy varies depending on the type of hypertension you have and how serious it is. °· Get help right away if you have symptoms that your health care provider told you to watch for. °This information is not intended to replace advice given to you by your health care provider. Make sure you discuss any questions you have with your health care provider. °Document Released: 04/30/2011 Document Revised: 07/29/2017 Document Reviewed: 01/26/2016 °Elsevier Interactive Patient Education © 2019 [employer] ° °

## 2018-10-27 NOTE — Addendum Note (Signed)
Addended by: Anell Barr on: 10/27/2018 03:53 PM   Modules accepted: Orders

## 2018-11-02 ENCOUNTER — Ambulatory Visit: Payer: Medicaid Other

## 2018-11-02 VITALS — BP 128/78 | HR 94 | Wt 151.1 lb

## 2018-11-02 DIAGNOSIS — Z0130 Encounter for examination of blood pressure without abnormal findings: Secondary | ICD-10-CM

## 2018-11-02 NOTE — Progress Notes (Signed)
Subjective:  Kelli Scott is a 23 y.o. female here for BP check.   Hypertension ROS: taking medications as instructed, not taking medications regularly as instructed, no medication side effects noted, no TIA's, no chest pain on exertion, no dyspnea on exertion and no swelling of ankles.    Objective:  BP 128/78   Pulse 94   Wt 151 lb 1.3 oz (68.5 kg)   LMP 07/17/2018   BMI 29.51 kg/m   Appearance alert, well appearing, and in no distress. General exam BP noted to be well controlled today in office.    Assessment:   Blood Pressure well controlled.   Plan:  Current treatment plan is effective, no change in therapy.Marland Kitchen

## 2018-11-03 NOTE — Progress Notes (Signed)
Chart reviewed - agree with CMA documentation.   

## 2018-11-17 ENCOUNTER — Other Ambulatory Visit: Payer: Self-pay

## 2018-11-17 ENCOUNTER — Telehealth: Payer: Self-pay

## 2018-11-17 DIAGNOSIS — O10919 Unspecified pre-existing hypertension complicating pregnancy, unspecified trimester: Secondary | ICD-10-CM

## 2018-11-17 DIAGNOSIS — Z3400 Encounter for supervision of normal first pregnancy, unspecified trimester: Secondary | ICD-10-CM

## 2018-11-17 NOTE — Telephone Encounter (Signed)
Patient called and reminded of early gtt and ob visit on Friday.  Also signed up for babyscripts platform. Armandina Stammer RN

## 2018-11-19 ENCOUNTER — Encounter: Payer: Medicaid Other | Admitting: Family Medicine

## 2018-11-20 ENCOUNTER — Other Ambulatory Visit: Payer: Self-pay

## 2018-11-20 ENCOUNTER — Ambulatory Visit (INDEPENDENT_AMBULATORY_CARE_PROVIDER_SITE_OTHER): Payer: Medicaid Other | Admitting: Family Medicine

## 2018-11-20 VITALS — BP 133/90 | HR 88 | Temp 98.1°F | Wt 160.0 lb

## 2018-11-20 DIAGNOSIS — Z3A18 18 weeks gestation of pregnancy: Secondary | ICD-10-CM

## 2018-11-20 DIAGNOSIS — O9989 Other specified diseases and conditions complicating pregnancy, childbirth and the puerperium: Secondary | ICD-10-CM

## 2018-11-20 DIAGNOSIS — N289 Disorder of kidney and ureter, unspecified: Secondary | ICD-10-CM

## 2018-11-20 DIAGNOSIS — O10912 Unspecified pre-existing hypertension complicating pregnancy, second trimester: Secondary | ICD-10-CM

## 2018-11-20 DIAGNOSIS — O10919 Unspecified pre-existing hypertension complicating pregnancy, unspecified trimester: Secondary | ICD-10-CM

## 2018-11-20 DIAGNOSIS — Z3400 Encounter for supervision of normal first pregnancy, unspecified trimester: Secondary | ICD-10-CM

## 2018-11-20 NOTE — Progress Notes (Signed)
   PRENATAL VISIT NOTE  Subjective:  Kelli Scott is a 22 y.o. G1P0 at [redacted]w[redacted]d being seen today for ongoing prenatal care.  She is currently monitored for the following issues for this high-risk pregnancy and has Supervision of normal first pregnancy, antepartum; BMI 30.0-30.9,adult; Function kidney decreased; Eczema; Hypertension, essential; and Chronic hypertension affecting pregnancy on their problem list.  Patient reports no complaints.  Contractions: Not present. Vag. Bleeding: None.  Movement: Present. Denies leaking of fluid.   The following portions of the patient's history were reviewed and updated as appropriate: allergies, current medications, past family history, past medical history, past social history, past surgical history and problem list.   Objective:   Vitals:   11/20/18 0813  BP: 133/90  Pulse: 88  Temp: 98.1 F (36.7 C)  Weight: 160 lb (72.6 kg)    Fetal Status:     Movement: Present     General:  Alert, oriented and cooperative. Patient is in no acute distress.  Skin: Skin is warm and dry. No rash noted.   Cardiovascular: Normal heart rate noted  Respiratory: Normal respiratory effort, no problems with respiration noted  Abdomen: Soft, gravid, appropriate for gestational age.  Pain/Pressure: Absent     Pelvic: Cervical exam deferred        Extremities: Normal range of motion.  Edema: None  Mental Status: Normal mood and affect. Normal behavior. Normal judgment and thought content.   Assessment and Plan:  Pregnancy: G1P0 at [redacted]w[redacted]d 1. Supervision of normal first pregnancy, antepartum FHT and FH normal. Weight gain normal.  2. Chronic hypertension affecting pregnancy BP normal  3. Decreased kidney function Cr from prenatal labs 0.63. Has appt with [facility]  Preterm labor symptoms and general obstetric precautions including but not limited to vaginal bleeding, contractions, leaking of fluid and fetal movement were reviewed in detail with the patient.  Please refer to After Visit Summary for other counseling recommendations.   Return in about 4 weeks (around 12/18/2018) for OB f/u (televisit).  Future Appointments  Date Time Provider Department Center  12/04/2018  8:00 AM [facility]-M[facility] NURSE [facility]-MFC MFC-US  12/04/2018  8:00 AM WH-MFC [place] 3 [facility]-MFC[facility] MFC-US    Levie Heritage, DO

## 2018-11-21 LAB — GLUCOSE TOLERANCE, 2 HOURS W/ 1HR
GLUCOSE, 1 HOUR: 116 mg/dL (ref 65–179)
Glucose, 2 hour: 98 mg/dL (ref 65–152)
Glucose, Fasting: 75 mg/dL (ref 65–91)

## 2018-12-04 ENCOUNTER — Encounter (HOSPITAL_COMMUNITY): Payer: Self-pay

## 2018-12-04 ENCOUNTER — Ambulatory Visit (HOSPITAL_COMMUNITY): Payer: Medicaid Other | Admitting: *Deleted

## 2018-12-04 ENCOUNTER — Ambulatory Visit (HOSPITAL_COMMUNITY)
Admission: RE | Admit: 2018-12-04 | Discharge: 2018-12-04 | Disposition: A | Discharge status: Home / Self Care | Payer: Medicaid Other | Source: Ambulatory Visit | Attending: Obstetrics and Gynecology | Admitting: Obstetrics and Gynecology

## 2018-12-04 ENCOUNTER — Other Ambulatory Visit: Payer: Self-pay

## 2018-12-04 VITALS — BP 132/90 | HR 95 | Temp 98.6°F

## 2018-12-04 DIAGNOSIS — I10 Essential (primary) hypertension: Secondary | ICD-10-CM | POA: Diagnosis not present

## 2018-12-04 DIAGNOSIS — Z3A20 20 weeks gestation of pregnancy: Secondary | ICD-10-CM

## 2018-12-04 DIAGNOSIS — Z6830 Body mass index (BMI) 30.0-30.9, adult: Secondary | ICD-10-CM

## 2018-12-04 DIAGNOSIS — O99332 Smoking (tobacco) complicating pregnancy, second trimester: Secondary | ICD-10-CM

## 2018-12-04 DIAGNOSIS — O2692 Pregnancy related conditions, unspecified, second trimester: Secondary | ICD-10-CM

## 2018-12-04 DIAGNOSIS — O10919 Unspecified pre-existing hypertension complicating pregnancy, unspecified trimester: Secondary | ICD-10-CM

## 2018-12-04 DIAGNOSIS — N289 Disorder of kidney and ureter, unspecified: Secondary | ICD-10-CM

## 2018-12-04 DIAGNOSIS — O99212 Obesity complicating pregnancy, second trimester: Secondary | ICD-10-CM | POA: Diagnosis not present

## 2018-12-04 DIAGNOSIS — O283 Abnormal ultrasonic finding on antenatal screening of mother: Secondary | ICD-10-CM

## 2018-12-04 DIAGNOSIS — O10012 Pre-existing essential hypertension complicating pregnancy, second trimester: Secondary | ICD-10-CM | POA: Diagnosis not present

## 2018-12-04 DIAGNOSIS — Z363 Encounter for antenatal screening for malformations: Secondary | ICD-10-CM

## 2018-12-08 ENCOUNTER — Other Ambulatory Visit (HOSPITAL_COMMUNITY): Payer: Self-pay | Admitting: *Deleted

## 2018-12-08 DIAGNOSIS — O10919 Unspecified pre-existing hypertension complicating pregnancy, unspecified trimester: Secondary | ICD-10-CM

## 2018-12-14 DIAGNOSIS — O283 Abnormal ultrasonic finding on antenatal screening of mother: Secondary | ICD-10-CM | POA: Insufficient documentation

## 2018-12-21 ENCOUNTER — Other Ambulatory Visit: Payer: Self-pay | Admitting: Nephrology

## 2018-12-21 DIAGNOSIS — O10919 Unspecified pre-existing hypertension complicating pregnancy, unspecified trimester: Secondary | ICD-10-CM

## 2018-12-23 ENCOUNTER — Encounter: Payer: Self-pay | Admitting: Obstetrics & Gynecology

## 2018-12-23 ENCOUNTER — Ambulatory Visit (INDEPENDENT_AMBULATORY_CARE_PROVIDER_SITE_OTHER): Payer: Medicaid Other | Admitting: Obstetrics & Gynecology

## 2018-12-23 DIAGNOSIS — I10 Essential (primary) hypertension: Secondary | ICD-10-CM

## 2018-12-23 DIAGNOSIS — Z3400 Encounter for supervision of normal first pregnancy, unspecified trimester: Secondary | ICD-10-CM

## 2018-12-23 DIAGNOSIS — O10919 Unspecified pre-existing hypertension complicating pregnancy, unspecified trimester: Secondary | ICD-10-CM

## 2018-12-23 DIAGNOSIS — N289 Disorder of kidney and ureter, unspecified: Secondary | ICD-10-CM

## 2018-12-23 DIAGNOSIS — Z6830 Body mass index (BMI) 30.0-30.9, adult: Secondary | ICD-10-CM

## 2018-12-23 DIAGNOSIS — L309 Dermatitis, unspecified: Secondary | ICD-10-CM

## 2018-12-23 DIAGNOSIS — O26892 Other specified pregnancy related conditions, second trimester: Secondary | ICD-10-CM

## 2018-12-23 DIAGNOSIS — O10912 Unspecified pre-existing hypertension complicating pregnancy, second trimester: Secondary | ICD-10-CM

## 2018-12-23 DIAGNOSIS — O283 Abnormal ultrasonic finding on antenatal screening of mother: Secondary | ICD-10-CM

## 2018-12-23 NOTE — Progress Notes (Signed)
Error

## 2018-12-23 NOTE — Progress Notes (Signed)
   PRENATAL VISIT NOTE TELEHEALTH VIRTUAL OBSTETRICS VISIT ENCOUNTER NOTE  I connected with pt at 1:20pm.  22w 5d on 12/23/18 at  1:15 PM EDT by Webex at home and verified that I am speaking with the correct person using two identifiers.   I discussed the limitations, risks, security and privacy concerns of performing an evaluation and management service by telephone and the availability of in person appointments. I also discussed with the patient that there may be a patient responsible charge related to this service. The patient expressed understanding and agreed to proceed. Subjective:  Purpose Barca is a 23 y.o. G1P0 at [redacted]w[redacted]d being seen today for ongoing prenatal care.  She is currently monitored for the following issues for this high-risk pregnancy and has Supervision of normal first pregnancy, antepartum; BMI 30.0-30.9,adult; Function kidney decreased; Eczema; Hypertension, essential; Chronic hypertension affecting pregnancy; and Abnormal antenatal ultrasound on their problem list.  Patient reports being afraid during the Covid pandemic. 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 Reports fetal movement. Contractions: Not present. Vag. Bleeding: None.  Movement: Present. Denies any contractions, bleeding or leaking of fluid.   The following portions of the patient's history were reviewed and updated as appropriate: allergies, current medications, past family history, past medical history, past social history, past surgical history and problem list.   Objective:  There were no vitals filed for this visit.  Fetal Status:     Movement: Present     General:  Alert, oriented and cooperative. Patient is in no acute distress.  Respiratory: Normal respiratory effort, no problems with respiration noted  Mental Status: Normal mood and affect. Normal behavior. Normal judgment and thought content.  Rest of physical exam deferred due to type of encounter  Assessment and Plan:   Pregnancy: G1P0 at [redacted]w[redacted]d 1. Supervision of normal first pregnancy, antepartum  2. Hypertension, essential BP at renal visit 138/82 BP cuff will be provided for pt. She is to call in 1 week if she does not get the BP cuff by that time.   3. Function kidney decreased scheduled to see nephrology her Renal US 12/25/2018  4. Eczema, unspecified type  5. Chronic hypertension affecting pregnancy  6. BMI 30.0-30.9,adult  7. Abnormal antenatal ultrasound F/u  US 01/15/2019   8. Work/social  I spent the majority of the time educating the pt about protecting herself at work and answered all of her questions.   Preterm labor symptoms and general obstetric precautions including but not limited to vaginal bleeding, contractions, leaking of fluid and fetal movement were reviewed in detail with the patient. I discussed the assessment and treatment plan with the patient. The patient was provided an opportunity to ask questions and all were answered. The patient agreed with the plan and demonstrated an understanding of the instructions. The patient was advised to call back or seek an in-person office evaluation/go to [facility]U at [facility] for any urgent or concerning symptoms. Please refer to After Visit Summary for other counseling recommendations.   I provided 18 minutes of non-face-to-face time during this encounter.  Return in about 4 weeks (around 01/20/2019) for virtual visit.  Future Appointments  Date Time Provider Department Center  12/25/2018  3:30 PM GI-315 [place] 1 [id number] GI-315 W. [facility]  01/15/2019  8:30 AM WH-MFC NURSE WH-MFC MFC-US  01/15/2019  8:30 AM WH-MFC [place] 1 WH-MFCUS MFC-US    Willodean Rosenthal, MD Center for [facility], [facility]

## 2018-12-23 NOTE — Progress Notes (Signed)
Patient agrees to [facility] visit and had been identified with 2 patient identifiers. Armandina Stammer RN

## 2018-12-25 ENCOUNTER — Ambulatory Visit
Admission: RE | Admit: 2018-12-25 | Discharge: 2018-12-25 | Disposition: A | Discharge status: Home / Self Care | Payer: Medicaid Other | Source: Ambulatory Visit | Attending: Nephrology | Admitting: Nephrology

## 2018-12-25 ENCOUNTER — Other Ambulatory Visit: Payer: Self-pay

## 2018-12-25 DIAGNOSIS — O10919 Unspecified pre-existing hypertension complicating pregnancy, unspecified trimester: Secondary | ICD-10-CM

## 2018-12-25 NOTE — Progress Notes (Signed)
Pt called the office requesting a work note to be excused from work for 3 days. Pt states that her feet have been swelling,she is having lower abdominal pressure and lower back pain. 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 Pt made aware that we can provide a work restrictions letter for her. Understanding was voiced.  chiquita l wilson, CMA

## 2018-12-28 ENCOUNTER — Telehealth: Payer: Self-pay

## 2019-01-15 ENCOUNTER — Other Ambulatory Visit: Payer: Self-pay

## 2019-01-15 ENCOUNTER — Ambulatory Visit (HOSPITAL_COMMUNITY)
Admission: RE | Admit: 2019-01-15 | Discharge: 2019-01-15 | Disposition: A | Discharge status: Home / Self Care | Payer: Medicaid Other | Source: Ambulatory Visit | Attending: Obstetrics and Gynecology | Admitting: Obstetrics and Gynecology

## 2019-01-15 ENCOUNTER — Ambulatory Visit (HOSPITAL_COMMUNITY): Payer: Medicaid Other | Admitting: *Deleted

## 2019-01-15 ENCOUNTER — Other Ambulatory Visit (HOSPITAL_COMMUNITY): Payer: Self-pay | Admitting: *Deleted

## 2019-01-15 ENCOUNTER — Encounter (HOSPITAL_COMMUNITY): Payer: Self-pay

## 2019-01-15 VITALS — BP 135/99 | HR 96 | Temp 98.1°F

## 2019-01-15 DIAGNOSIS — O2692 Pregnancy related conditions, unspecified, second trimester: Secondary | ICD-10-CM

## 2019-01-15 DIAGNOSIS — O99332 Smoking (tobacco) complicating pregnancy, second trimester: Secondary | ICD-10-CM

## 2019-01-15 DIAGNOSIS — Z3400 Encounter for supervision of normal first pregnancy, unspecified trimester: Secondary | ICD-10-CM | POA: Insufficient documentation

## 2019-01-15 DIAGNOSIS — O283 Abnormal ultrasonic finding on antenatal screening of mother: Secondary | ICD-10-CM | POA: Insufficient documentation

## 2019-01-15 DIAGNOSIS — O99212 Obesity complicating pregnancy, second trimester: Secondary | ICD-10-CM

## 2019-01-15 DIAGNOSIS — O10919 Unspecified pre-existing hypertension complicating pregnancy, unspecified trimester: Secondary | ICD-10-CM

## 2019-01-15 DIAGNOSIS — O10012 Pre-existing essential hypertension complicating pregnancy, second trimester: Secondary | ICD-10-CM | POA: Diagnosis not present

## 2019-01-15 DIAGNOSIS — Z3A26 26 weeks gestation of pregnancy: Secondary | ICD-10-CM

## 2019-01-15 DIAGNOSIS — Z362 Encounter for other antenatal screening follow-up: Secondary | ICD-10-CM

## 2019-01-20 ENCOUNTER — Other Ambulatory Visit: Payer: Self-pay

## 2019-01-20 ENCOUNTER — Encounter: Payer: Self-pay | Admitting: Obstetrics and Gynecology

## 2019-01-20 ENCOUNTER — Ambulatory Visit (INDEPENDENT_AMBULATORY_CARE_PROVIDER_SITE_OTHER): Payer: Medicaid Other | Admitting: Obstetrics and Gynecology

## 2019-01-20 DIAGNOSIS — Z6830 Body mass index (BMI) 30.0-30.9, adult: Secondary | ICD-10-CM

## 2019-01-20 DIAGNOSIS — Z3A26 26 weeks gestation of pregnancy: Secondary | ICD-10-CM

## 2019-01-20 DIAGNOSIS — O283 Abnormal ultrasonic finding on antenatal screening of mother: Secondary | ICD-10-CM

## 2019-01-20 DIAGNOSIS — N289 Disorder of kidney and ureter, unspecified: Secondary | ICD-10-CM

## 2019-01-20 DIAGNOSIS — O10912 Unspecified pre-existing hypertension complicating pregnancy, second trimester: Secondary | ICD-10-CM

## 2019-01-20 DIAGNOSIS — O10919 Unspecified pre-existing hypertension complicating pregnancy, unspecified trimester: Secondary | ICD-10-CM

## 2019-01-20 DIAGNOSIS — Z3400 Encounter for supervision of normal first pregnancy, unspecified trimester: Secondary | ICD-10-CM

## 2019-01-20 NOTE — Progress Notes (Signed)
Has not taken BP today gets it done at work.

## 2019-01-20 NOTE — Progress Notes (Signed)
   TELEHEALTH OBSTETRICS PRENATAL VIRTUAL VIDEO VISIT ENCOUNTER NOTE  Provider location: Center for [facility]   I connected with Legrand Rams on 01/20/19 at  3:00 PM EDT by WebEx Video Encounter at home and verified that I am speaking with the correct person using two identifiers.   I discussed the limitations, risks, security and privacy concerns of performing an evaluation and management service by telephone and the availability of in person appointments. I also discussed with the patient that there may be a patient responsible charge related to this service. The patient expressed understanding and agreed to proceed. Subjective:  Kelli Scott is a 23 y.o. G1P0 at [redacted]w[redacted]d being seen today for ongoing prenatal care.  She is currently monitored for the following issues for this high-risk pregnancy and has Supervision of normal first pregnancy, antepartum; BMI 30.0-30.9,adult; Function kidney decreased; Eczema; Hypertension, essential; Chronic hypertension affecting pregnancy; and Abnormal antenatal ultrasound on their problem list.  Patient reports occasional headaches that improve with tylenol, has occasional cramping that stops with rest.  Contractions: Not present. Vag. Bleeding: None.  Movement: Present. Denies any leaking of fluid.   The following portions of the patient's history were reviewed and updated as appropriate: allergies, current medications, past family history, past medical history, past social history, past surgical history and problem list.   Objective:  There were no vitals filed for this visit.  Fetal Status:     Movement: Present     General:  Alert, oriented and cooperative. Patient is in no acute distress.  Respiratory: Normal respiratory effort, no problems with respiration noted  Mental Status: Normal mood and affect. Normal behavior. Normal judgment and thought content.  Rest of physical exam deferred due to type of encounter   Assessment and Plan:  Pregnancy: G1P0 at [redacted]w[redacted]d  1. Supervision of normal first pregnancy, antepartum Has been having headaches, usually improved with tylenol, encouraged her to take tylenol as needed and to present to MAU with worsening symptoms  2. BMI 30.0-30.9,adult  3. Function kidney decreased Repeat CMP at 28 weeks  4. Abnormal antenatal ultrasound F/u was normal Repeat scheduled for 02/12/19  5. 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 I discussed the assessment and treatment plan with the patient. The patient was provided an opportunity to ask questions and all were answered. The patient agreed with the plan and demonstrated an understanding of the instructions. The patient was advised to call back or seek an in-person office evaluation/go to MAU at [facility] for any urgent or concerning symptoms. Please refer to After Visit Summary for other counseling recommendations.   I provided 16 minutes of face-to-face time during this encounter.  Return in about 2 weeks (around 02/03/2019) for 2 hr GTT, in person, OB visit (MD), 3rd trim labs.  Future Appointments  Date Time Provider Department Center  02/12/2019  1:00 PM WH-MFC NURSE WH-MFC MFC-US  02/12/2019  1:00 PM WH-MFC [place] 3 WH-MFCUS MFC-US    Conan Bowens, MD Center for [facility], [facility]

## 2019-01-29 ENCOUNTER — Other Ambulatory Visit: Payer: Self-pay

## 2019-01-29 ENCOUNTER — Ambulatory Visit (INDEPENDENT_AMBULATORY_CARE_PROVIDER_SITE_OTHER): Payer: Medicaid Other | Admitting: Family Medicine

## 2019-01-29 VITALS — BP 155/93 | HR 91 | Wt 159.0 lb

## 2019-01-29 DIAGNOSIS — Z3400 Encounter for supervision of normal first pregnancy, unspecified trimester: Secondary | ICD-10-CM

## 2019-01-29 DIAGNOSIS — O26833 Pregnancy related renal disease, third trimester: Secondary | ICD-10-CM

## 2019-01-29 DIAGNOSIS — Z3A28 28 weeks gestation of pregnancy: Secondary | ICD-10-CM

## 2019-01-29 DIAGNOSIS — O10913 Unspecified pre-existing hypertension complicating pregnancy, third trimester: Secondary | ICD-10-CM

## 2019-01-29 DIAGNOSIS — N289 Disorder of kidney and ureter, unspecified: Secondary | ICD-10-CM

## 2019-01-29 DIAGNOSIS — O10919 Unspecified pre-existing hypertension complicating pregnancy, unspecified trimester: Secondary | ICD-10-CM

## 2019-01-29 MED ORDER — LABETALOL HCL 200 MG PO TABS: 200.0000 mg | ORAL_TABLET | Freq: Two times a day (BID) | ORAL | 3 refills | Status: DC

## 2019-01-29 NOTE — Progress Notes (Signed)
   PRENATAL VISIT NOTE  Subjective:  Kelli Scott is a 23 y.o. G1P0 at [redacted]w[redacted]d being seen today for ongoing prenatal care.  She is currently monitored for the following issues for this high-risk pregnancy and has Supervision of normal first pregnancy, antepartum; BMI 30.0-30.9,adult; Function kidney decreased; Eczema; Hypertension, essential; Chronic hypertension affecting pregnancy; and Abnormal antenatal ultrasound on their problem list.  Patient reports a lot of stress at work - Had note with restrictions, but her supervisor is not accomodating the restrictions. She has spoken to HR, who states that it is up to the supervisor..  Contractions: Not present. Vag. Bleeding: None.  Movement: Present. Denies leaking of fluid.   The following portions of the patient's history were reviewed and updated as appropriate: allergies, current medications, past family history, past medical history, past social history, past surgical history and problem list.   Objective:   Vitals:   01/29/19 0918  BP: (!) 155/93  Pulse: 91  Weight: 159 lb (72.1 kg)    Fetal Status: Fetal Heart Rate (bpm): 145   Movement: Present     General:  Alert, oriented and cooperative. Patient is in no acute distress.  Skin: Skin is warm and dry. No rash noted.   Cardiovascular: Normal heart rate noted  Respiratory: Normal respiratory effort, no problems with respiration noted  Abdomen: Soft, gravid, appropriate for gestational age.  Pain/Pressure: Present     Pelvic: Cervical exam deferred        Extremities: Normal range of motion.  Edema: Trace  Mental Status: Normal mood and affect. Normal behavior. Normal judgment and thought content.   Assessment and Plan:  Pregnancy: G1P0 at [redacted]w[redacted]d 1. Supervision of normal first pregnancy, antepartum FHT and FH normal - CBC - Glucose Tolerance, 2 Hours w/1 Hour - HIV antibody (with reflex) - RPR - Comprehensive metabolic panel  2. Function kidney decreased - CBC - Glucose  Tolerance, 2 Hours w/1 Hour - HIV antibody (with reflex) - RPR - Comprehensive metabolic panel  3. Chronic hypertension affecting pregnancy Increased BP today - will start labetalol 200mg  BID. Stress at work is probably contributing to her elevated BP. Discussed options with patient - will start FMLA June 28, due to high risk pregnancy and BP control. Has F/u US 6/19.  Antenatal testing 32 weeks. F/u in 2 weeks in office.  - CBC - Glucose Tolerance, 2 Hours w/1 Hour - HIV antibody (with reflex) - RPR - Comprehensive metabolic panel  Preterm labor symptoms and general obstetric precautions including but not limited to vaginal bleeding, contractions, leaking of fluid and fetal movement were reviewed in detail with the patient. Please refer to After Visit Summary for other counseling recommendations.   No follow-ups on file.  Future Appointments  Date Time Provider Department Center  02/12/2019 10:30 AM Levie Heritage, DO [facility] None  02/12/2019  1:00 PM WH-MFC NURSE WH-MFC MFC-US  02/12/2019  1:00 PM WH-MFC [place] 3 WH-MFCUS MFC-US    Levie Heritage, DO

## 2019-01-29 NOTE — Progress Notes (Signed)
Patient presents for ob visit and 28 week labs. Armandina Stammer RN

## 2019-01-30 LAB — CBC
Hematocrit: 31.4 % — ABNORMAL LOW (ref 34.0–46.6)
Hemoglobin: 10.8 g/dL — ABNORMAL LOW (ref 11.1–15.9)
MCH: 29.1 pg (ref 26.6–33.0)
MCHC: 34.4 g/dL (ref 31.5–35.7)
MCV: 85 fL (ref 79–97)
Platelets: 179 10*3/uL (ref 150–450)
RBC: 3.71 x10E6/uL — ABNORMAL LOW (ref 3.77–5.28)
RDW: 12.8 % (ref 11.7–15.4)
WBC: 10.7 10*3/uL (ref 3.4–10.8)

## 2019-01-30 LAB — COMPREHENSIVE METABOLIC PANEL
ALT: 11 IU/L (ref 0–32)
AST: 18 IU/L (ref 0–40)
Albumin/Globulin Ratio: 1.5 (ref 1.2–2.2)
Albumin: 3.8 g/dL — ABNORMAL LOW (ref 3.9–5.0)
Alkaline Phosphatase: 107 IU/L (ref 39–117)
BUN/Creatinine Ratio: 10 (ref 9–23)
BUN: 5 mg/dL — ABNORMAL LOW (ref 6–20)
Bilirubin Total: <0.2 mg/dL (ref 0.0–1.2)
CO2: 20 mmol/L (ref 20–29)
Calcium: 9.3 mg/dL (ref 8.7–10.2)
Chloride: 104 mmol/L (ref 96–106)
Creatinine, Ser: 0.49 mg/dL — ABNORMAL LOW (ref 0.57–1.00)
GFR calc Af Amer: 160 mL/min/{1.73_m2} (ref >59)
GFR calc non Af Amer: 139 mL/min/{1.73_m2} (ref >59)
Globulin, Total: 2.5 g/dL (ref 1.5–4.5)
Glucose: 82 mg/dL (ref 65–99)
Potassium: 3.2 mmol/L — ABNORMAL LOW (ref 3.5–5.2)
Sodium: 138 mmol/L (ref 134–144)
Total Protein: 6.3 g/dL (ref 6.0–8.5)

## 2019-01-30 LAB — RPR: RPR Ser Ql: NONREACTIVE

## 2019-01-30 LAB — GLUCOSE TOLERANCE, 2 HOURS W/ 1HR
Glucose, 1 hour: 148 mg/dL (ref 65–179)
Glucose, 2 hour: 121 mg/dL (ref 65–152)
Glucose, Fasting: 83 mg/dL (ref 65–91)

## 2019-01-30 LAB — HIV ANTIBODY (ROUTINE TESTING W REFLEX): HIV Screen 4th Generation wRfx: NONREACTIVE

## 2019-02-12 ENCOUNTER — Encounter (HOSPITAL_COMMUNITY): Payer: Self-pay | Admitting: *Deleted

## 2019-02-12 ENCOUNTER — Ambulatory Visit (INDEPENDENT_AMBULATORY_CARE_PROVIDER_SITE_OTHER): Payer: Medicaid Other | Admitting: Family Medicine

## 2019-02-12 ENCOUNTER — Ambulatory Visit (HOSPITAL_COMMUNITY): Payer: Medicaid Other | Admitting: *Deleted

## 2019-02-12 ENCOUNTER — Other Ambulatory Visit (HOSPITAL_COMMUNITY): Payer: Self-pay | Admitting: *Deleted

## 2019-02-12 ENCOUNTER — Other Ambulatory Visit: Payer: Self-pay

## 2019-02-12 ENCOUNTER — Ambulatory Visit (HOSPITAL_COMMUNITY)
Admission: RE | Admit: 2019-02-12 | Discharge: 2019-02-12 | Disposition: A | Discharge status: Home / Self Care | Payer: Medicaid Other | Source: Ambulatory Visit | Attending: Obstetrics and Gynecology | Admitting: Obstetrics and Gynecology

## 2019-02-12 VITALS — BP 144/90 | HR 89 | Wt 165.0 lb

## 2019-02-12 DIAGNOSIS — O10919 Unspecified pre-existing hypertension complicating pregnancy, unspecified trimester: Secondary | ICD-10-CM | POA: Diagnosis not present

## 2019-02-12 DIAGNOSIS — Z6830 Body mass index (BMI) 30.0-30.9, adult: Secondary | ICD-10-CM

## 2019-02-12 DIAGNOSIS — O2693 Pregnancy related conditions, unspecified, third trimester: Secondary | ICD-10-CM

## 2019-02-12 DIAGNOSIS — O283 Abnormal ultrasonic finding on antenatal screening of mother: Secondary | ICD-10-CM

## 2019-02-12 DIAGNOSIS — Z3A30 30 weeks gestation of pregnancy: Secondary | ICD-10-CM

## 2019-02-12 DIAGNOSIS — Z3400 Encounter for supervision of normal first pregnancy, unspecified trimester: Secondary | ICD-10-CM

## 2019-02-12 DIAGNOSIS — O99333 Smoking (tobacco) complicating pregnancy, third trimester: Secondary | ICD-10-CM

## 2019-02-12 DIAGNOSIS — O99213 Obesity complicating pregnancy, third trimester: Secondary | ICD-10-CM | POA: Diagnosis not present

## 2019-02-12 DIAGNOSIS — O10013 Pre-existing essential hypertension complicating pregnancy, third trimester: Secondary | ICD-10-CM | POA: Diagnosis not present

## 2019-02-12 DIAGNOSIS — Z362 Encounter for other antenatal screening follow-up: Secondary | ICD-10-CM | POA: Diagnosis not present

## 2019-02-12 NOTE — Progress Notes (Signed)
   PRENATAL VISIT NOTE  Subjective:  Kelli Scott is a 23 y.o. G1P0 at [redacted]w[redacted]d being seen today for ongoing prenatal care.  She is currently monitored for the following issues for this high-risk pregnancy and has Supervision of normal first pregnancy, antepartum; BMI 30.0-30.9,adult; Function kidney decreased; Eczema; Hypertension, essential; Chronic hypertension affecting pregnancy; and Abnormal antenatal ultrasound on their problem list.  Patient reports no complaints.  Contractions: Not present. Vag. Bleeding: None.  Movement: Present. Denies leaking of fluid.   The following portions of the patient's history were reviewed and updated as appropriate: allergies, current medications, past family history, past medical history, past social history, past surgical history and problem list.   Objective:   Vitals:   02/12/19 1007 02/12/19 1037  BP: (!) 155/101 (!) 144/90  Pulse: 91 89  Weight: 165 lb (74.8 kg)     Fetal Status:     Movement: Present     General:  Alert, oriented and cooperative. Patient is in no acute distress.  Skin: Skin is warm and dry. No rash noted.   Cardiovascular: Normal heart rate noted  Respiratory: Normal respiratory effort, no problems with respiration noted  Abdomen: Soft, gravid, appropriate for gestational age.  Pain/Pressure: Present     Pelvic: Cervical exam deferred        Extremities: Normal range of motion.  Edema: Trace  Mental Status: Normal mood and affect. Normal behavior. Normal judgment and thought content.   Assessment and Plan:  Pregnancy: G1P0 at [redacted]w[redacted]d 1. Supervision of normal first pregnancy, antepartum FHT and FH normal  2. BMI 30.0-30.9,adult   3. Chronic hypertension affecting pregnancy Growth [place] today Antenatal testing in 2 weeks. Continue labetalol - [place] MFM FETAL BPP WO NON STRESS; Future  Preterm labor symptoms and general obstetric precautions including but not limited to vaginal bleeding, contractions, leaking of fluid and  fetal movement were reviewed in detail with the patient. Please refer to After Visit Summary for other counseling recommendations.   Return in about 2 weeks (around 02/26/2019) for OB f/u, In Office.  Future Appointments  Date Time Provider [place]  02/12/2019  1:00 PM [place] [facility]  02/12/2019  1:00 PM [place] 3 WH-MFCUS [facility]-US  03/01/2019 10:00 AM Truett Mainland, DO [facility] None    Truett Mainland, DO

## 2019-02-22 ENCOUNTER — Telehealth: Payer: Self-pay

## 2019-02-22 NOTE — Telephone Encounter (Signed)
Pt called the office stating she is having some numbness in right hand. Pt states that she think it is carpel tunnel. Advised pt to use a brace on hand. Pt states that she has been taking tylenol and it has provided some relief.Understanding was voiced.  Kelli Scott l Ron Junco, CMA

## 2019-03-01 ENCOUNTER — Ambulatory Visit (HOSPITAL_COMMUNITY)
Admission: RE | Admit: 2019-03-01 | Discharge: 2019-03-01 | Disposition: A | Discharge status: Home / Self Care | Payer: Medicaid Other | Source: Ambulatory Visit | Attending: Obstetrics and Gynecology | Admitting: Obstetrics and Gynecology

## 2019-03-01 ENCOUNTER — Encounter: Payer: Medicaid Other | Admitting: Family Medicine

## 2019-03-01 ENCOUNTER — Ambulatory Visit (HOSPITAL_COMMUNITY): Payer: Medicaid Other

## 2019-03-04 ENCOUNTER — Other Ambulatory Visit: Payer: Self-pay

## 2019-03-04 ENCOUNTER — Ambulatory Visit (INDEPENDENT_AMBULATORY_CARE_PROVIDER_SITE_OTHER): Payer: Medicaid Other | Admitting: Family Medicine

## 2019-03-04 VITALS — BP 155/111 | HR 93 | Wt 174.0 lb

## 2019-03-04 DIAGNOSIS — Z3A32 32 weeks gestation of pregnancy: Secondary | ICD-10-CM

## 2019-03-04 DIAGNOSIS — N764 Abscess of vulva: Secondary | ICD-10-CM

## 2019-03-04 DIAGNOSIS — Z3400 Encounter for supervision of normal first pregnancy, unspecified trimester: Secondary | ICD-10-CM

## 2019-03-04 DIAGNOSIS — O10919 Unspecified pre-existing hypertension complicating pregnancy, unspecified trimester: Secondary | ICD-10-CM

## 2019-03-04 DIAGNOSIS — O26893 Other specified pregnancy related conditions, third trimester: Secondary | ICD-10-CM

## 2019-03-04 DIAGNOSIS — O10913 Unspecified pre-existing hypertension complicating pregnancy, third trimester: Secondary | ICD-10-CM

## 2019-03-04 DIAGNOSIS — N289 Disorder of kidney and ureter, unspecified: Secondary | ICD-10-CM

## 2019-03-04 MED ORDER — SULFAMETHOXAZOLE-TRIMETHOPRIM 800-160 MG PO TABS: 1.0000 | ORAL_TABLET | Freq: Two times a day (BID) | ORAL | 1 refills | Status: DC

## 2019-03-04 MED ORDER — LABETALOL HCL 200 MG PO TABS: 400.0000 mg | ORAL_TABLET | Freq: Two times a day (BID) | ORAL | 3 refills | Status: DC

## 2019-03-04 NOTE — Progress Notes (Signed)
   PRENATAL VISIT NOTE  Subjective:  Kelli Scott is a 23 y.o. G1P0 at [redacted]w[redacted]d being seen today for ongoing prenatal care.  She is currently monitored for the following issues for this high-risk pregnancy and has Supervision of normal first pregnancy, antepartum; BMI 30.0-30.9,adult; Function kidney decreased; Eczema; Hypertension, essential; Chronic hypertension affecting pregnancy; and Abnormal antenatal ultrasound on their problem list.  Patient reports vaginal abscess. Put on warm compress and it drained some..  Contractions: Irregular. Vag. Bleeding: None.  Movement: Present. Denies leaking of fluid.   The following portions of the patient's history were reviewed and updated as appropriate: allergies, current medications, past family history, past medical history, past social history, past surgical history and problem list.   Objective:   Vitals:   03/04/19 1402 03/04/19 1407  BP: (!) 156/104 (!) 155/111  Pulse: 96 93  Weight: 174 lb (78.9 kg)     Fetal Status: Fetal Heart Rate (bpm): 141   Movement: Present     General:  Alert, oriented and cooperative. Patient is in no acute distress.  Skin: Skin is warm and dry. No rash noted.   Cardiovascular: Normal heart rate noted  Respiratory: Normal respiratory effort, no problems with respiration noted  Abdomen: Soft, gravid, appropriate for gestational age.  Pain/Pressure: Absent     Pelvic: Cervical exam deferred      1cm left labial abscess. Some induration. NO fluctuance  Extremities: Normal range of motion.  Edema: Trace  Mental Status: Normal mood and affect. Normal behavior. Normal judgment and thought content.   Assessment and Plan:  Pregnancy: G1P0 at [redacted]w[redacted]d 1. Supervision of normal first pregnancy, antepartum FH and FHT normal  2. Function kidney decreased Cr normal  3. Chronic hypertension affecting pregnancy Increase labetalol to 400mg  BID BPP weekly  4. Labial abscess Bactrim. Warm compresses. Return if increases in  size.  Preterm labor symptoms and general obstetric precautions including but not limited to vaginal bleeding, contractions, leaking of fluid and fetal movement were reviewed in detail with the patient. Please refer to After Visit Summary for other counseling recommendations.   Return in about 2 weeks (around 03/18/2019) for OB f/u, In Office.  Future Appointments  Date Time Provider [place]  03/05/2019  3:30 PM [place] [facility]  03/05/2019  3:30 PM [place] 1 WH-MFCUS [facility]-US  03/08/2019  2:30 PM [place] [facility]  03/08/2019  2:30 PM [place] 1 WH-MFCUS [facility]-US  03/15/2019 10:15 AM WH-MFC NURSE WH-MFC [facility]-US  03/15/2019 10:15 AM [facility]-MFC [place] 4 WH-MFCUS MFC-US    Artisha Capri J Cleland Simkins, DO

## 2019-03-05 ENCOUNTER — Encounter (HOSPITAL_COMMUNITY): Payer: Self-pay

## 2019-03-05 ENCOUNTER — Ambulatory Visit (HOSPITAL_COMMUNITY)
Admission: RE | Admit: 2019-03-05 | Discharge: 2019-03-05 | Disposition: A | Discharge status: Home / Self Care | Payer: Medicaid Other | Source: Ambulatory Visit | Attending: Obstetrics and Gynecology | Admitting: Obstetrics and Gynecology

## 2019-03-05 ENCOUNTER — Other Ambulatory Visit: Payer: Self-pay

## 2019-03-05 ENCOUNTER — Ambulatory Visit (HOSPITAL_COMMUNITY): Payer: Medicaid Other | Admitting: *Deleted

## 2019-03-05 DIAGNOSIS — Z3400 Encounter for supervision of normal first pregnancy, unspecified trimester: Secondary | ICD-10-CM | POA: Diagnosis present

## 2019-03-05 DIAGNOSIS — O99213 Obesity complicating pregnancy, third trimester: Secondary | ICD-10-CM

## 2019-03-05 DIAGNOSIS — O10013 Pre-existing essential hypertension complicating pregnancy, third trimester: Secondary | ICD-10-CM | POA: Diagnosis not present

## 2019-03-05 DIAGNOSIS — O2693 Pregnancy related conditions, unspecified, third trimester: Secondary | ICD-10-CM

## 2019-03-05 DIAGNOSIS — O283 Abnormal ultrasonic finding on antenatal screening of mother: Secondary | ICD-10-CM | POA: Diagnosis present

## 2019-03-05 DIAGNOSIS — O10919 Unspecified pre-existing hypertension complicating pregnancy, unspecified trimester: Secondary | ICD-10-CM | POA: Diagnosis not present

## 2019-03-05 DIAGNOSIS — Z3A33 33 weeks gestation of pregnancy: Secondary | ICD-10-CM

## 2019-03-05 DIAGNOSIS — O99333 Smoking (tobacco) complicating pregnancy, third trimester: Secondary | ICD-10-CM | POA: Diagnosis not present

## 2019-03-08 ENCOUNTER — Ambulatory Visit (HOSPITAL_COMMUNITY): Payer: Medicaid Other

## 2019-03-15 ENCOUNTER — Encounter (HOSPITAL_COMMUNITY): Payer: Self-pay

## 2019-03-15 ENCOUNTER — Ambulatory Visit (HOSPITAL_BASED_OUTPATIENT_CLINIC_OR_DEPARTMENT_OTHER)
Admission: RE | Admit: 2019-03-15 | Discharge: 2019-03-15 | Disposition: A | Discharge status: Home / Self Care | Payer: Medicaid Other | Source: Ambulatory Visit | Attending: Obstetrics and Gynecology | Admitting: Obstetrics and Gynecology

## 2019-03-15 ENCOUNTER — Other Ambulatory Visit: Payer: Self-pay

## 2019-03-15 ENCOUNTER — Ambulatory Visit (HOSPITAL_COMMUNITY): Payer: Medicaid Other | Admitting: *Deleted

## 2019-03-15 ENCOUNTER — Inpatient Hospital Stay (HOSPITAL_COMMUNITY)
Admission: AD | Admit: 2019-03-15 | Discharge: 2019-03-22 | DRG: 788 | Disposition: A | Discharge status: Home / Self Care | Payer: Medicaid Other | Attending: Obstetrics and Gynecology | Admitting: Obstetrics and Gynecology

## 2019-03-15 ENCOUNTER — Other Ambulatory Visit (HOSPITAL_COMMUNITY): Payer: Self-pay | Admitting: *Deleted

## 2019-03-15 VITALS — BP 175/122 | HR 91 | Temp 98.6°F

## 2019-03-15 DIAGNOSIS — O10913 Unspecified pre-existing hypertension complicating pregnancy, third trimester: Secondary | ICD-10-CM

## 2019-03-15 DIAGNOSIS — O283 Abnormal ultrasonic finding on antenatal screening of mother: Secondary | ICD-10-CM | POA: Diagnosis present

## 2019-03-15 DIAGNOSIS — O2693 Pregnancy related conditions, unspecified, third trimester: Secondary | ICD-10-CM | POA: Diagnosis not present

## 2019-03-15 DIAGNOSIS — Z362 Encounter for other antenatal screening follow-up: Secondary | ICD-10-CM

## 2019-03-15 DIAGNOSIS — O114 Pre-existing hypertension with pre-eclampsia, complicating childbirth: Secondary | ICD-10-CM | POA: Diagnosis present

## 2019-03-15 DIAGNOSIS — O99333 Smoking (tobacco) complicating pregnancy, third trimester: Secondary | ICD-10-CM | POA: Diagnosis not present

## 2019-03-15 DIAGNOSIS — O1490 Unspecified pre-eclampsia, unspecified trimester: Secondary | ICD-10-CM

## 2019-03-15 DIAGNOSIS — O99334 Smoking (tobacco) complicating childbirth: Secondary | ICD-10-CM | POA: Diagnosis present

## 2019-03-15 DIAGNOSIS — Z98891 History of uterine scar from previous surgery: Secondary | ICD-10-CM

## 2019-03-15 DIAGNOSIS — R030 Elevated blood-pressure reading, without diagnosis of hypertension: Secondary | ICD-10-CM | POA: Diagnosis present

## 2019-03-15 DIAGNOSIS — Z3A34 34 weeks gestation of pregnancy: Secondary | ICD-10-CM

## 2019-03-15 DIAGNOSIS — Z1159 Encounter for screening for other viral diseases: Secondary | ICD-10-CM

## 2019-03-15 DIAGNOSIS — F17210 Nicotine dependence, cigarettes, uncomplicated: Secondary | ICD-10-CM | POA: Diagnosis present

## 2019-03-15 DIAGNOSIS — O10919 Unspecified pre-existing hypertension complicating pregnancy, unspecified trimester: Secondary | ICD-10-CM

## 2019-03-15 DIAGNOSIS — O119 Pre-existing hypertension with pre-eclampsia, unspecified trimester: Secondary | ICD-10-CM | POA: Diagnosis present

## 2019-03-15 DIAGNOSIS — O10013 Pre-existing essential hypertension complicating pregnancy, third trimester: Secondary | ICD-10-CM | POA: Diagnosis not present

## 2019-03-15 DIAGNOSIS — Z6830 Body mass index (BMI) 30.0-30.9, adult: Secondary | ICD-10-CM

## 2019-03-15 DIAGNOSIS — N289 Disorder of kidney and ureter, unspecified: Secondary | ICD-10-CM

## 2019-03-15 DIAGNOSIS — O1002 Pre-existing essential hypertension complicating childbirth: Secondary | ICD-10-CM | POA: Diagnosis present

## 2019-03-15 DIAGNOSIS — O99213 Obesity complicating pregnancy, third trimester: Secondary | ICD-10-CM | POA: Diagnosis not present

## 2019-03-15 DIAGNOSIS — Z3400 Encounter for supervision of normal first pregnancy, unspecified trimester: Secondary | ICD-10-CM

## 2019-03-15 DIAGNOSIS — O0990 Supervision of high risk pregnancy, unspecified, unspecified trimester: Secondary | ICD-10-CM

## 2019-03-15 DIAGNOSIS — O113 Pre-existing hypertension with pre-eclampsia, third trimester: Secondary | ICD-10-CM | POA: Diagnosis not present

## 2019-03-15 LAB — CBC
HCT: 33.5 % — ABNORMAL LOW (ref 36.0–46.0)
Hemoglobin: 10.5 g/dL — ABNORMAL LOW (ref 12.0–15.0)
MCH: 26.9 pg (ref 26.0–34.0)
MCHC: 31.3 g/dL (ref 30.0–36.0)
MCV: 85.7 fL (ref 80.0–100.0)
Platelets: 168 10*3/uL (ref 150–400)
RBC: 3.91 MIL/uL (ref 3.87–5.11)
RDW: 13.7 % (ref 11.5–15.5)
WBC: 8.8 10*3/uL (ref 4.0–10.5)
nRBC: 0 % (ref 0.0–0.2)

## 2019-03-15 LAB — ABO/RH: ABO/RH(D): O POS

## 2019-03-15 LAB — COMPREHENSIVE METABOLIC PANEL
ALT: 19 U/L (ref 0–44)
AST: 33 U/L (ref 15–41)
Albumin: 3.1 g/dL — ABNORMAL LOW (ref 3.5–5.0)
Alkaline Phosphatase: 160 U/L — ABNORMAL HIGH (ref 38–126)
Anion gap: 9 (ref 5–15)
BUN: 6 mg/dL (ref 6–20)
CO2: 19 mmol/L — ABNORMAL LOW (ref 22–32)
Calcium: 9.1 mg/dL (ref 8.9–10.3)
Chloride: 106 mmol/L (ref 98–111)
Creatinine, Ser: 0.71 mg/dL (ref 0.44–1.00)
GFR calc Af Amer: >60 mL/min (ref >60)
GFR calc non Af Amer: >60 mL/min (ref >60)
Glucose, Bld: 96 mg/dL (ref 70–99)
Potassium: 4.2 mmol/L (ref 3.5–5.1)
Sodium: 134 mmol/L — ABNORMAL LOW (ref 135–145)
Total Bilirubin: 0.2 mg/dL — ABNORMAL LOW (ref 0.3–1.2)
Total Protein: 6.6 g/dL (ref 6.5–8.1)

## 2019-03-15 LAB — URINALYSIS, ROUTINE W REFLEX MICROSCOPIC
Bilirubin Urine: NEGATIVE
Glucose, UA: NEGATIVE mg/dL
Hgb urine dipstick: NEGATIVE
Ketones, ur: NEGATIVE mg/dL
Leukocytes,Ua: NEGATIVE
Nitrite: NEGATIVE
Protein, ur: >=300 mg/dL — AB
Specific Gravity, Urine: 1.017 (ref 1.005–1.030)
pH: 6 (ref 5.0–8.0)

## 2019-03-15 LAB — SARS CORONAVIRUS 2 BY RT PCR (HOSPITAL ORDER, PERFORMED IN ~~LOC~~ HOSPITAL LAB): SARS Coronavirus 2: NEGATIVE

## 2019-03-15 LAB — PROTEIN / CREATININE RATIO, URINE
Creatinine, Urine: 203.95 mg/dL
Protein Creatinine Ratio: 1.5 mg/mg{Cre} — ABNORMAL HIGH (ref 0.00–0.15)
Total Protein, Urine: 305 mg/dL

## 2019-03-15 LAB — TYPE AND SCREEN
ABO/RH(D): O POS
Antibody Screen: NEGATIVE

## 2019-03-15 MED ORDER — CALCIUM CARBONATE ANTACID 500 MG PO CHEW
2.0000 | CHEWABLE_TABLET | ORAL | Status: DC | PRN
  Administered 2019-03-15 – 2019-03-16 (×3): 400 mg via ORAL
  Filled 2019-03-15 (×4): qty 2

## 2019-03-15 MED ORDER — PRENATAL MULTIVITAMIN CH
1.0000 | ORAL_TABLET | Freq: Every day | ORAL | Status: DC
  Administered 2019-03-16: 1 via ORAL
  Filled 2019-03-15: qty 1

## 2019-03-15 MED ORDER — LACTATED RINGERS IV SOLN
INTRAVENOUS | Status: DC
  Administered 2019-03-15 – 2019-03-18 (×5): via INTRAVENOUS

## 2019-03-15 MED ORDER — LABETALOL HCL 5 MG/ML IV SOLN
40.0000 mg | INTRAVENOUS | Status: DC | PRN
  Administered 2019-03-15: 17:00:00 40 mg via INTRAVENOUS
  Filled 2019-03-15: qty 8

## 2019-03-15 MED ORDER — ACETAMINOPHEN 325 MG PO TABS
650.0000 mg | ORAL_TABLET | ORAL | Status: DC | PRN
  Administered 2019-03-15 – 2019-03-16 (×3): 650 mg via ORAL
  Filled 2019-03-15 (×3): qty 2

## 2019-03-15 MED ORDER — LACTATED RINGERS IV SOLN
INTRAVENOUS | Status: DC
  Administered 2019-03-15 – 2019-03-16 (×3): via INTRAVENOUS
  Administered 2019-03-16: 125 mL/h via INTRAVENOUS
  Administered 2019-03-16: 11:00:00 via INTRAVENOUS

## 2019-03-15 MED ORDER — LABETALOL HCL 200 MG PO TABS
400.0000 mg | ORAL_TABLET | Freq: Two times a day (BID) | ORAL | Status: DC
  Administered 2019-03-15 – 2019-03-16 (×3): 400 mg via ORAL
  Filled 2019-03-15 (×3): qty 2

## 2019-03-15 MED ORDER — DOCUSATE SODIUM 100 MG PO CAPS
100.0000 mg | ORAL_CAPSULE | Freq: Every day | ORAL | Status: DC
  Administered 2019-03-15: 18:00:00 100 mg via ORAL
  Filled 2019-03-15 (×2): qty 1

## 2019-03-15 MED ORDER — LABETALOL HCL 5 MG/ML IV SOLN: 80.0000 mg | INTRAVENOUS | Status: DC | PRN

## 2019-03-15 MED ORDER — SODIUM CHLORIDE 0.9 % IV SOLN
8.0000 mg | Freq: Four times a day (QID) | INTRAVENOUS | Status: DC | PRN
  Administered 2019-03-15 – 2019-03-16 (×2): 8 mg via INTRAVENOUS
  Filled 2019-03-15 (×2): qty 4

## 2019-03-15 MED ORDER — HYDRALAZINE HCL 20 MG/ML IJ SOLN
10.0000 mg | INTRAMUSCULAR | Status: DC | PRN
  Administered 2019-03-15: 18:00:00 10 mg via INTRAVENOUS
  Filled 2019-03-15 (×2): qty 1

## 2019-03-15 MED ORDER — LABETALOL HCL 5 MG/ML IV SOLN
INTRAVENOUS | Status: AC
  Administered 2019-03-15: 13:00:00 20 mg via INTRAVENOUS
  Filled 2019-03-15: qty 4

## 2019-03-15 MED ORDER — LABETALOL HCL 5 MG/ML IV SOLN
20.0000 mg | INTRAVENOUS | Status: DC | PRN
  Administered 2019-03-15 (×2): 20 mg via INTRAVENOUS
  Filled 2019-03-15: qty 4

## 2019-03-15 MED ORDER — ZOLPIDEM TARTRATE 5 MG PO TABS
5.0000 mg | ORAL_TABLET | Freq: Every evening | ORAL | Status: DC | PRN
  Administered 2019-03-16: 21:00:00 5 mg via ORAL
  Filled 2019-03-15: qty 1

## 2019-03-15 MED ORDER — BETAMETHASONE SOD PHOS & ACET 6 (3-3) MG/ML IJ SUSP
12.0000 mg | INTRAMUSCULAR | Status: AC
  Administered 2019-03-15 – 2019-03-16 (×2): 12 mg via INTRAMUSCULAR
  Filled 2019-03-15 (×2): qty 2

## 2019-03-15 NOTE — Progress Notes (Signed)
Report called to Ginger Morris, RN, CN in MAU. Patient advised to have further evaluation in MAU for elevated BP's.

## 2019-03-15 NOTE — Progress Notes (Signed)
BP taken while pt vomiting

## 2019-03-15 NOTE — MAU Provider Note (Signed)
History     CSN: [id number]  Arrival date and time: 03/15/19 1239   First Provider Initiated Contact with Patient 03/15/19 1317      Chief Complaint  Patient presents with  . Hypertension   HPI  Ms.  Kelli Scott is a 23 y.o. year old G1P0 female at 2488w3d weeks gestation who was sent to MAU from MFM for 3 elevated BPs (160s to 170s/120s). She has a h/o nephrectomy, cHTN on Labetalol 200 mg AM & hs. She took her AM dose of Labetalol @ 1100 while she was at MFM after eating a snack. [facility]/110. The [facility] RN was advised to have patient transported to MAU immediately. Report called from Dr. Judeth CornfieldShankar @ 1205. He reports the patient's "face appears to be swollen."  **Once in Rm. 120, CNM in room to evaluate. Patient appears to be asymptomatic while getting undressed. PEC labs & IV Labetalol protocol entered in Epic.    Past Medical History:  Diagnosis Date  . Chronic kidney disease   . Hypertension     Past Surgical History:  Procedure Laterality Date  . NEPHRECTOMY Left   . WISDOM TOOTH EXTRACTION Bilateral     Family History  Problem Relation Age of Onset  . Hypertension Father   . Hypertension Brother   . Stroke Brother     Social History   Tobacco Use  . Smoking status: Current Some Day Smoker    Types: Cigarettes  . Smokeless tobacco: Never Used  Substance Use Topics  . Alcohol use: Not Currently  . Drug use: Not Currently    Allergies:  Allergies  Allergen Reactions  . Ibuprofen     Medications Prior to Admission  Medication Sig Dispense Refill Last Dose  . aspirin EC 81 MG tablet Take 81 mg by mouth daily.     . Doxylamine-Pyridoxine (DICLEGIS) 10-10 MG TBEC Take 2 tablets by mouth at bedtime. If symptoms persist, add one tablet in the morning and one in the afternoon (Patient not taking: Reported on 01/15/2019) 100 tablet 5   . labetalol  (NORMODYNE) 200 MG tablet Take 2 tablets (400 mg total) by mouth 2 (two) times daily. 120 tablet 3   . Prenatal MV-Min-FA-Omega-3 (PRENATAL GUMMIES/DHA & FA) 0.4-32.5 MG CHEW Chew by mouth.     . sulfamethoxazole-trimethoprim (BACTRIM DS) 800-160 MG tablet Take 1 tablet by mouth 2 (two) times daily. (Patient not taking: Reported on 03/15/2019) 14 tablet 1     Review of Systems  Constitutional: Negative.   HENT: Negative.   Eyes: Negative.   Respiratory: Negative.   Cardiovascular: Negative.   Gastrointestinal: Negative.   Endocrine: Negative.   Genitourinary: Negative.   Musculoskeletal: Negative.   Skin: Negative.   Allergic/Immunologic: Negative.   Neurological: Negative.   Hematological: Negative.   Psychiatric/Behavioral: Negative.    Physical Exam   Patient Vitals for the past 24 hrs:  BP Temp Temp src Pulse Resp SpO2  03/15/19 1501 (!) 148/96 - - 75 - -  03/15/19 1450 (!) 152/99 - - 86 - 99 %  03/15/19 1445 - - - - - 99 %  03/15/19 1441 (!) 138/97 - - 82 - -  03/15/19 1410 (!) 144/92 - - 87 - 99 %  03/15/19 1400 (!) 142/92 - - 88 - 99 %  03/15/19 1350 (!) 149/102 - - 92 - 99 %  03/15/19 1340 (!)  145/92 - - 86 - 99 %  03/15/19 1330 (!) 147/94 - - 88 - 99 %  03/15/19 1320 (!) 155/107 - - 92 - -  03/15/19 1317 (!) 154/104 - - 90 - -  03/15/19 1310 (!) 163/127 98.5 F (36.9 C) Oral 94 18 99 %  03/15/19 1250 (!) 183/118 98.1 F (36.7 C) - 90 17 100 %    Physical Exam  Nursing note and vitals reviewed. Constitutional: She is oriented to person, place, and time. She appears well-developed and well-nourished.  HENT:  Head: Normocephalic and atraumatic.  Eyes: Pupils are equal, round, and reactive to light.  Neck: Normal range of motion.  Cardiovascular: Normal rate, regular rhythm, normal heart sounds and intact distal pulses.  Respiratory: Effort normal and breath sounds normal.  GI: Soft. Bowel sounds are normal.  Genitourinary:    Genitourinary Comments: Pelvic  deferred   Musculoskeletal: Normal range of motion.  Neurological: She is alert and oriented to person, place, and time. She has normal reflexes.  2+ DTRs bilaterally, no beats of clonus  Skin: Skin is warm and dry.  Psychiatric: She has a normal mood and affect. Her behavior is normal. Judgment and thought content normal.   NST - FHR: 135 bpm / moderate variability / accels present / variable decels present / TOCO: Occ UC's  MAU Course  Procedures  MDM CCUA CBC CMP P/C Ratio Serial BPs CEFM  *Consult with Dr. Roselie Awkward @ 1452 - notified of patient's complaints, assessments, lab & NST results, recommended tx plan admission to [facility]  Results for orders placed or performed during the hospital encounter of 03/15/19 (from the past 24 hour(s))  Protein / creatinine ratio, urine     Status: Abnormal   Collection Time: 03/15/19  1:14 PM  Result Value Ref Range   Creatinine, Urine 203.95 mg/dL   Total Protein, Urine 305 mg/dL   Protein Creatinine Ratio 1.50 (H) 0.00 - 0.15 mg/mg[Cre]  Urinalysis, Routine w reflex microscopic     Status: Abnormal   Collection Time: 03/15/19  1:14 PM  Result Value Ref Range   Color, Urine YELLOW YELLOW   APPearance HAZY (A) CLEAR   Specific Gravity, Urine 1.017 1.005 - 1.030   pH 6.0 5.0 - 8.0   Glucose, UA NEGATIVE NEGATIVE mg/dL   Hgb urine dipstick NEGATIVE NEGATIVE   Bilirubin Urine NEGATIVE NEGATIVE   Ketones, ur NEGATIVE NEGATIVE mg/dL   Protein, ur >=300 (A) NEGATIVE mg/dL   Nitrite NEGATIVE NEGATIVE   Leukocytes,Ua NEGATIVE NEGATIVE   RBC / HPF 0-5 0 - 5 RBC/hpf   WBC, UA 0-5 0 - 5 WBC/hpf   Bacteria, UA RARE (A) NONE SEEN   Squamous Epithelial / LPF 0-5 0 - 5   Mucus PRESENT   CBC     Status: Abnormal   Collection Time: 03/15/19  1:14 PM  Result Value Ref Range   WBC 8.8 4.0 - 10.5 K/uL   RBC 3.91 3.87 - 5.11 MIL/uL   Hemoglobin 10.5 (L) 12.0 - 15.0 g/dL   HCT 33.5 (L) 36.0 - 46.0 %   MCV 85.7 80.0 - 100.0 fL   MCH 26.9 26.0 -  34.0 pg   MCHC 31.3 30.0 - 36.0 g/dL   RDW 13.7 11.5 - 15.5 %   Platelets 168 150 - 400 K/uL   nRBC 0.0 0.0 - 0.2 %  Comprehensive metabolic panel     Status: Abnormal   Collection Time: 03/15/19  1:14 PM  Result Value Ref  Range   Sodium 134 (L) 135 - 145 mmol/L   Potassium 4.2 3.5 - 5.1 mmol/L   Chloride 106 98 - 111 mmol/L   CO2 19 (L) 22 - 32 mmol/L   Glucose, Bld 96 70 - 99 mg/dL   BUN 6 6 - 20 mg/dL   Creatinine, Ser 1.610.71 0.44 - 1.00 mg/dL   Calcium 9.1 8.9 - 09.610.3 mg/dL   Total Protein 6.6 6.5 - 8.1 g/dL   Albumin 3.1 (L) 3.5 - 5.0 g/dL   AST 33 15 - 41 U/L   ALT 19 0 - 44 U/L   Alkaline Phosphatase 160 (H) 38 - 126 U/L   Total Bilirubin 0.2 (L) 0.3 - 1.2 mg/dL   GFR calc non Af Amer >60 >60 mL/min   GFR calc Af Amer >60 >60 mL/min   Anion gap 9 5 - 15    Assessment and Plan  Chronic hypertension with superimposed preeclampsia  - Admit to OBSCU - Admission orders entered per Dr. Olivia MackieArnold's recommendations - Care assumed by Dr. Debroah LoopArnold at the time of admission  Kelli Moraolitta Euriah Matlack, MSN, CNM 03/15/2019, 1:17 PM

## 2019-03-15 NOTE — MAU Note (Signed)
Kelli Scott is a 23 y.o. at [redacted]w[redacted]d here in MAU reporting: was at the office today with elevated BP and was sent over for evaluation. No headache, no visual changes, no epigastric pain. States she is having some pelvic pressure but no uc's, no bleeding, no LOF, +FM   Pain score: 0/10  Vitals:   03/15/19 1250  BP: (!) 183/118  Pulse: 90  Resp: 17  Temp: 98.1 F (36.7 C)  SpO2: 100%

## 2019-03-15 NOTE — Progress Notes (Signed)
Pt has not taken labetalol today.  Plans to take it after she eats breakfast.

## 2019-03-15 NOTE — ED Notes (Signed)
Per MAU- transport pt. STAT Transport in route.

## 2019-03-16 ENCOUNTER — Other Ambulatory Visit: Payer: Self-pay

## 2019-03-16 ENCOUNTER — Encounter (HOSPITAL_COMMUNITY): Payer: Self-pay | Admitting: *Deleted

## 2019-03-16 DIAGNOSIS — O283 Abnormal ultrasonic finding on antenatal screening of mother: Secondary | ICD-10-CM

## 2019-03-16 DIAGNOSIS — Z3A34 34 weeks gestation of pregnancy: Secondary | ICD-10-CM

## 2019-03-16 DIAGNOSIS — O113 Pre-existing hypertension with pre-eclampsia, third trimester: Secondary | ICD-10-CM

## 2019-03-16 MED ORDER — PENICILLIN G 3 MILLION UNITS IVPB - SIMPLE MED
3.0000 10*6.[IU] | INTRAVENOUS | Status: DC
  Filled 2019-03-16: qty 100

## 2019-03-16 MED ORDER — MAGNESIUM SULFATE BOLUS VIA INFUSION
4.0000 g | Freq: Once | INTRAVENOUS | Status: AC
  Administered 2019-03-16: 11:00:00 4 g via INTRAVENOUS
  Filled 2019-03-16: qty 500

## 2019-03-16 MED ORDER — MISOPROSTOL 50MCG HALF TABLET
50.0000 ug | ORAL_TABLET | ORAL | Status: DC | PRN
  Administered 2019-03-17 (×2): 50 ug via BUCCAL
  Filled 2019-03-16 (×2): qty 1

## 2019-03-16 MED ORDER — LABETALOL HCL 5 MG/ML IV SOLN: 80.0000 mg | INTRAVENOUS | Status: DC | PRN

## 2019-03-16 MED ORDER — MISOPROSTOL 25 MCG QUARTER TABLET
25.0000 ug | ORAL_TABLET | ORAL | Status: DC | PRN
  Administered 2019-03-16 (×3): 25 ug via VAGINAL
  Filled 2019-03-16 (×3): qty 1

## 2019-03-16 MED ORDER — LABETALOL HCL 5 MG/ML IV SOLN
20.0000 mg | INTRAVENOUS | Status: DC | PRN
  Administered 2019-03-16: 16:00:00 20 mg via INTRAVENOUS
  Filled 2019-03-16: qty 4

## 2019-03-16 MED ORDER — LABETALOL HCL 200 MG PO TABS: 200.0000 mg | ORAL_TABLET | Freq: Once | ORAL | Status: DC

## 2019-03-16 MED ORDER — MAGNESIUM SULFATE 40 G IN LACTATED RINGERS - SIMPLE
2.0000 g/h | INTRAVENOUS | Status: DC
  Administered 2019-03-17 – 2019-03-18 (×2): 2 g/h via INTRAVENOUS
  Filled 2019-03-16 (×3): qty 500

## 2019-03-16 MED ORDER — SODIUM CHLORIDE 0.9 % IV SOLN
5.0000 10*6.[IU] | Freq: Once | INTRAVENOUS | Status: AC
  Administered 2019-03-17: 06:00:00 5 10*6.[IU] via INTRAVENOUS
  Filled 2019-03-16: qty 5

## 2019-03-16 MED ORDER — LABETALOL HCL 5 MG/ML IV SOLN
40.0000 mg | INTRAVENOUS | Status: DC | PRN
  Administered 2019-03-16: 20:00:00 40 mg via INTRAVENOUS
  Filled 2019-03-16: qty 8

## 2019-03-16 MED ORDER — LABETALOL HCL 200 MG PO TABS
600.0000 mg | ORAL_TABLET | Freq: Two times a day (BID) | ORAL | Status: DC
  Administered 2019-03-16 – 2019-03-20 (×8): 600 mg via ORAL
  Filled 2019-03-16 (×8): qty 3

## 2019-03-16 MED ORDER — LABETALOL HCL 200 MG PO TABS: 600.0000 mg | ORAL_TABLET | Freq: Two times a day (BID) | ORAL | Status: DC

## 2019-03-16 MED ORDER — HYDRALAZINE HCL 20 MG/ML IJ SOLN: 10.0000 mg | INTRAMUSCULAR | Status: DC | PRN

## 2019-03-16 MED ORDER — TERBUTALINE SULFATE 1 MG/ML IJ SOLN: 0.2500 mg | Freq: Once | INTRAMUSCULAR | Status: DC | PRN

## 2019-03-16 NOTE — Progress Notes (Signed)
Chart Note  Kelli Scott, G1 P0 at 34w 4d gestation, was admitted yesterday with diagnosis of chronic hypertension with superimposed preeclampsia.   Her blood pressures at our office (10 AM) yesterday, were in the severe range (160/116, 175/122 and 161/121 mm Hg). Since admission, she received IV antihypertensives including labetalol (20/40/80 mg) and hydralazine (10 mg) for control of severe range blood pressures that were seen more than 4 hours apart. After admission, her severe range blood pressures ranged 162-183/110-118 mm Hg. She is taking labetalol 400 mg bid.  Patient also received first dose of betamethasone.  Labs including liver enzymes, platelets and creatinine are within normal limits.  In my opinion, the patient has chronic hypertension with superimposed preeclampsia with severe features. I had counseled the patient yesterday on the possibility of delivery.  Based on the diagnosis, I recommend delivery now. Delivery should not be delayed to complete steroid course.  I discussed with Dr. Roselie Awkward and he will be counseling the patient now.

## 2019-03-16 NOTE — H&P (Signed)
Attestation signed by Woodroe Mode, MD at 03/15/2019 4:18 PM    Attestation of Attending Supervision of Advanced Practitioner (CNM/NP/PA): Evaluation and management procedures were performed by the Advanced Practitioner under my supervision and collaboration.  I have reviewed the Advanced Practitioner's note and chart, and I agree with the management and plan.     Emeterio Reeve MD          Expand All Collapse All            Expand widget buttonCollapse widget button    Show:Clear all   ManualTemplateCopied  Added by:     Laury Deep, CNM   Hover for detailscustomization button                                                                                                                                                                                 History          CSN: [id number]     Arrival date and time: 03/15/19 1239      First Provider Initiated Contact with Patient 03/15/19 1317              Chief Complaint    Patient presents with    .   Hypertension      HPI     Ms.  Kelli Scott is a 23 y.o. year old G84P0 female at [redacted]w[redacted]d weeks gestation who was sent to MAU from MFM for 3 elevated BPs (160s to 170s/120s). She has a h/o nephrectomy, cHTN on Labetalol 200 mg AM & hs. She took her AM dose of Labetalol @ 1100 while she was at MFM after eating a snack. She stopped at [facility] before coming here. She then accidentally arrived to the [facility] where they called with BP of 183/110. The [facility] RN was advised to have patient transported to MAU immediately. Report called from Dr. Donalee Citrin @ 1205. He reports the patient's "face appears to be swollen."     **Once in Rm. 120, CNM in room to evaluate. Patient appears to be asymptomatic while getting undressed. PEC labs  & IV Labetalol protocol entered in Epic.            Past Medical History:    Diagnosis   Date    .   Chronic kidney disease        .   Hypertension                   Past Surgical History:    Procedure   Laterality   Date    .   NEPHRECTOMY   Left        .   WISDOM TOOTH EXTRACTION  Bilateral                   Family History    Problem   Relation   Age of Onset    .   Hypertension   Father        .   Hypertension   Brother        .   Stroke   Brother              Social History             Tobacco Use    .   Smoking status:   Current Some Day Smoker            Types:   Cigarettes    .   Smokeless tobacco:   Never Used    Substance Use Topics    .   Alcohol use:   Not Currently    .   Drug use:   Not Currently         Allergies:        Allergies    Allergen   Reactions    .   Ibuprofen                     Medications Prior to Admission    Medication   Sig   Dispense   Refill   Last Dose    .   aspirin EC 81 MG tablet   Take 81 mg by mouth daily.                .   Doxylamine-Pyridoxine (DICLEGIS) 10-10 MG TBEC   Take 2 tablets by mouth at bedtime. If symptoms persist, add one tablet in the morning and one in the afternoon (Patient not taking: Reported on 01/15/2019)   100 tablet   5        .   labetalol (NORMODYNE) 200 MG tablet   Take 2 tablets (400 mg total) by mouth 2 (two) times daily.   120 tablet   3        .   Prenatal MV-Min-FA-Omega-3 (PRENATAL GUMMIES/DHA & FA) 0.4-32.5 MG CHEW   Chew by mouth.                .   sulfamethoxazole-trimethoprim (BACTRIM DS) 800-160 MG tablet   Take 1 tablet by mouth 2 (two) times daily. (Patient not taking: Reported on 03/15/2019)   14 tablet   1             Review of Systems   Constitutional: Negative.     HENT: Negative.    Eyes: Negative.    Respiratory: Negative.    Cardiovascular: Negative.    Gastrointestinal: Negative.    Endocrine: Negative.    Genitourinary: Negative.    Musculoskeletal: Negative.    Skin: Negative.    Allergic/Immunologic: Negative.    Neurological: Negative.    Hematological: Negative.    Psychiatric/Behavioral: Negative.        Physical Exam       Patient Vitals for the past 24 hrs:       BP   Temp   Temp src   Pulse   Resp   SpO2    03/15/19 1501   (!) 148/96   -   -   75   -   -    03/15/19 1450   (!) 152/99   -   -  86   -   99 %    03/15/19 1445   -   -   -   -   -   99 %    03/15/19 1441   (!) 138/97   -   -   82   -   -    03/15/19 1410   (!) 144/92   -   -   87   -   99 %    03/15/19 1400   (!) 142/92   -   -   88   -   99 %    03/15/19 1350   (!) 149/102   -   -   92   -   99 %    03/15/19 1340   (!) 145/92   -   -   86   -   99 %    03/15/19 1330   (!) 147/94   -   -   88   -   99 %    03/15/19 1320   (!) 155/107   -   -   92   -   -    03/15/19 1317   (!) 154/104   -   -   90   -   -    03/15/19 1310   (!) 163/127   98.5 F (36.9 C)   Oral   94   18   99 %    03/15/19 1250   (!) 183/118   98.1 F (36.7 C)   -   90   17   100 %         Physical Exam   Nursing note and vitals reviewed.  Constitutional: She is oriented to person, place, and time. She appears well-developed and well-nourished.   HENT:   Head: Normocephalic and atraumatic.   Eyes: Pupils are equal, round, and reactive to light.   Neck: Normal range of motion.   Cardiovascular: Normal rate, regular rhythm, normal heart sounds and intact distal pulses.   Respiratory: Effort normal and breath sounds normal.   GI: Soft. Bowel sounds are normal.   Genitourinary:    Genitourinary Comments: Pelvic deferred    Musculoskeletal: Normal range of motion.  Neurological: She is alert and oriented to person, place, and time. She has normal reflexes.  2+ DTRs bilaterally, no beats of clonus   Skin: Skin is warm and dry.  Psychiatric: She has a normal mood and affect. Her behavior is normal. Judgment and thought content normal.      NST - FHR: 135 bpm / moderate variability / accels present / variable decels present / TOCO: Occ UC's      MAU Course    Procedures     MDM  CCUA  CBC  CMP  P/C Ratio  Serial BPs  CEFM     *Consult with Dr. Debroah LoopArnold @ 1452 - notified of patient's complaints, assessments, lab & NST results, [facility]     Lab Results Last 24 Hours  Assessment and Plan    Chronic hypertension with superimposed preeclampsia   - Admit to OBSCU  - Admission orders entered per Dr. Olivia MackieArnold's recommendations  - Care assumed by Dr. Debroah LoopArnold at the time of admission     Kelli Moraolitta Dawson, MSN, CNM  03/15/2019, 1:17 PM

## 2019-03-16 NOTE — Progress Notes (Signed)
Patient ID: Kelli Scott, female   DOB: March 03, 1996, 23 y.o.   MRN: [id number] FACULTY PRACTICE ANTEPARTUM(COMPREHENSIVE) NOTE  Kelli Scott is a 23 y.o. G1P0 at 111w4d who [facility] with severe features.   Fetal presentation is cephalic. Length of Stay:  1  Days  Subjective: Headache Patient reports the fetal movement as active. Patient reports uterine contraction  activity as none. Patient reports  vaginal bleeding as none. Patient describes fluid per vagina as None.  Vitals:  Blood pressure (!) 145/88, pulse 96, temperature 98.2 F (36.8 C), temperature source Oral, resp. rate 18, height 4\' 11"  (1.499 m), weight 79.4 kg, last menstrual period 07/17/2018, SpO2 98 %. Physical Examination:  General appearance - alert, well appearing, and in no distress Heart - normal rate and regular rhythm Abdomen - soft, nontender, nondistended Fundal Height:  size equals dates Cervical Exam: Not evaluated. . Extremities: extremities normal, atraumatic, no cyanosis or edema and Homans sign is negative, no sign of DVT  Fetal Monitoring:  Fetal Heart Rate A  Mode External  [pt sitting up eating] filed at 03/16/2019 0915  Baseline Rate (A) 120 bpm filed at 03/16/2019 0900  Variability <5 BPM, 6-25 BPM filed at 03/16/2019 0900  Accelerations 15 x 15 filed at 03/16/2019 0900  Decelerations Variable filed at 03/16/2019 0900     Labs:  Results for orders placed or performed during the hospital encounter of 03/15/19 (from the past 24 hour(s))  Protein / creatinine ratio, urine   Collection Time: 03/15/19  1:14 PM  Result Value Ref Range   Creatinine, Urine 203.95 mg/dL   Total Protein, Urine 305 mg/dL   Protein Creatinine Ratio 1.50 (H) 0.00 - 0.15 mg/mg[Cre]  Urinalysis, Routine w reflex microscopic   Collection Time: 03/15/19  1:14 PM  Result Value Ref Range   Color, Urine YELLOW YELLOW   APPearance HAZY (A) CLEAR   Specific Gravity, Urine 1.017 1.005 - 1.030   pH 6.0  5.0 - 8.0   Glucose, UA NEGATIVE NEGATIVE mg/dL   Hgb urine dipstick NEGATIVE NEGATIVE   Bilirubin Urine NEGATIVE NEGATIVE   Ketones, ur NEGATIVE NEGATIVE mg/dL   Protein, ur >=956>=300 (A) NEGATIVE mg/dL   Nitrite NEGATIVE NEGATIVE   Leukocytes,Ua NEGATIVE NEGATIVE   RBC / HPF 0-5 0 - 5 RBC/hpf   WBC, UA 0-5 0 - 5 WBC/hpf   Bacteria, UA RARE (A) NONE SEEN   Squamous Epithelial / LPF 0-5 0 - 5   Mucus PRESENT   CBC   Collection Time: 03/15/19  1:14 PM  Result Value Ref Range   WBC 8.8 4.0 - 10.5 K/uL   RBC 3.91 3.87 - 5.11 MIL/uL   Hemoglobin 10.5 (L) 12.0 - 15.0 g/dL   HCT 21.333.5 (L) 08.636.0 - 57.846.0 %   MCV 85.7 80.0 - 100.0 fL   MCH 26.9 26.0 - 34.0 pg   MCHC 31.3 30.0 - 36.0 g/dL   RDW 46.913.7 62.911.5 - 52.815.5 %   Platelets 168 150 - 400 K/uL   nRBC 0.0 0.0 - 0.2 %  Comprehensive metabolic panel   Collection Time: 03/15/19  1:14 PM  Result Value Ref Range   Sodium 134 (L) 135 - 145 mmol/L   Potassium 4.2 3.5 - 5.1 mmol/L   Chloride 106 98 - 111 mmol/L   CO2 19 (L) 22 - 32 mmol/L   Glucose, Bld 96 70 - 99 mg/dL   BUN 6 6 - 20 mg/dL   Creatinine, Ser 4.130.71 0.44 -  1.00 mg/dL   Calcium 9.1 8.9 - 10.3 mg/dL   Total Protein 6.6 6.5 - 8.1 g/dL   Albumin 3.1 (L) 3.5 - 5.0 g/dL   AST 33 15 - 41 U/L   ALT 19 0 - 44 U/L   Alkaline Phosphatase 160 (H) 38 - 126 U/L   Total Bilirubin 0.2 (L) 0.3 - 1.2 mg/dL   GFR calc non Af Amer >60 >60 mL/min   GFR calc Af Amer >60 >60 mL/min   Anion gap 9 5 - 15  Type and screen [place]   Collection Time: 03/15/19  1:15 PM  Result Value Ref Range   ABO/RH(D) O POS    Antibody Screen NEG    Sample Expiration      03/18/2019,2359 Performed at [facility], [place] [address], [place], [place] [address]   ABO/Rh   Collection Time: 03/15/19  1:15 PM  Result Value Ref Range   ABO/RH(D)      O POS Performed at [facility] [address], [place], [place] [address]   SARS Coronavirus 2 (CEPHEID - Performed in [facility]  hospital lab), [facility] Order   Collection Time: 03/15/19  3:43 PM   Specimen: Vaginal/Rectal; Nasopharyngeal  Result Value Ref Range   SARS Coronavirus 2 NEGATIVE NEGATIVE     Medications:  Scheduled . betamethasone acetate-betamethasone sodium phosphate  12 mg Intramuscular Q24H  . docusate sodium  100 mg Oral Daily  . labetalol  400 mg Oral BID  . prenatal multivitamin  1 tablet Oral Q1200   I have reviewed the patient's current medications.  ASSESSMENT: Patient Active Problem List   Diagnosis Date Noted  . Chronic hypertension with superimposed preeclampsia 03/15/2019  . Abnormal antenatal ultrasound 12/14/2018  . Supervision of normal first pregnancy, antepartum 09/17/2018  . Chronic hypertension affecting pregnancy 09/17/2018  . BMI 30.0-30.9,adult 07/04/2015  . Eczema 09/22/2014  . Hypertension, essential 06/10/2014  . Function kidney decreased 05/17/2013    PLAN: SIPIH severe features [redacted]w[redacted]d Spoke to Dr. Donalee Citrin who reviewed her BP and hospital course. He recommends delivery. I spoke to the patient and she agrees with plan to transfer to LD and be induced.  Emeterio Reeve 03/16/2019,10:09 AM

## 2019-03-16 NOTE — Progress Notes (Signed)
LABOR PROGRESS NOTE  Kelli Scott is a 23 y.o. G1P0 at [redacted]w[redacted]d admitted for IOL due to [place] with superimposed pre-eclampsia with severe features.  Subjective: Doing well with no complaints at this time.  Objective: BP (!) 144/92   Pulse 85   Temp 98.2 F (36.8 C) (Oral)   Resp 18   Ht 5' (1.524 m)   Wt 79.4 kg   LMP 07/17/2018   SpO2 100%   BMI 34.18 kg/m  or  Vitals:   03/16/19 1138 03/16/19 1151 03/16/19 1242 03/16/19 1250  BP:  (!) 138/94  (!) 144/92  Pulse:  90  85  Resp:  18 20 18   Temp:      TempSrc:      SpO2:      Weight: 79.4 kg     Height: 5' (1.524 m)      Dilation: Closed Effacement (%): Thick Station: -3 Presentation: Vertex Exam by:: K. SHaw, CNM and C. Sherral Hammers, RN FHT: Baseline rate 120, moderate varibility, +accel, -decel Toco: Occasional contractions   Labs: Lab Results  Component Value Date   WBC 8.8 03/15/2019   HGB 10.5 (L) 03/15/2019   HCT 33.5 (L) 03/15/2019   MCV 85.7 03/15/2019   PLT 168 03/15/2019    Patient Active Problem List   Diagnosis Date Noted  . Chronic hypertension with superimposed preeclampsia 03/15/2019  . Abnormal antenatal ultrasound 12/14/2018  . Supervision of normal first pregnancy, antepartum 09/17/2018  . Chronic hypertension affecting pregnancy 09/17/2018  . BMI 30.0-30.9,adult 07/04/2015  . Eczema 09/22/2014  . Hypertension, essential 06/10/2014  . Function kidney decreased 05/17/2013    Assessment / Plan: 23 y.o. G1P0 at [redacted]w[redacted]d here for IOL due to cHTN with superimposed pre-eclampsia with severe features.  cHTN with superimposed pre-eclampsia with SF: On Mag; last BP controlled. Will continue PO Labetalol 400 mg BID and anti-hypertensive protocol PRN. Will continue to monitor symptoms. Labor: Induction initiation with cervical ripening. Will start with Cytotec and reassess in ~4 hours. Fetal Wellbeing: Category 1 tracing as above  Pain Control: Planning for epidural  Anticipated MOD: SVD  Vilma Meckel,  MD  Family Medicine, PGY-2 03/16/2019, 2:05 PM

## 2019-03-16 NOTE — Plan of Care (Signed)
  Problem: Education: Goal: Knowledge of Childbirth will improve Outcome: Progressing Goal: Ability to state and carry out methods to decrease the pain will improve Outcome: Progressing

## 2019-03-16 NOTE — Progress Notes (Signed)
LABOR PROGRESS NOTE  Kelli Scott is a 23 y.o. G1P0 at [redacted]w[redacted]d admitted for IOL due to [place] with superimposed pre-eclampsia with severe features.  Subjective: Doing well overall. Experiencing some mild cramping here and there. Denies any vision changes, headache, SOB, or RUQ pain.  Objective: BP (!) 154/82   Pulse 89   Temp 98.2 F (36.8 C) (Oral)   Resp 20   Ht 5' (1.524 m)   Wt 79.4 kg   LMP 07/17/2018   SpO2 100%   BMI 34.18 kg/m  or  Vitals:   03/16/19 1614 03/16/19 1628 03/16/19 1644 03/16/19 1735  BP: (!) 165/102 (!) 159/101 (!) 153/96 (!) 154/82  Pulse: 84 89 77 89  Resp: 18 20 18 20   Temp:      TempSrc:      SpO2:      Weight:      Height:       Dilation: Closed Effacement (%): Thick Station: -3 Presentation: Vertex Exam by:: Philis Pique, RN FHT: Baseline rate 120, moderate varibility, +accels, -decels Toco: Occasional; uterine irritability  Labs: Lab Results  Component Value Date   WBC 8.8 03/15/2019   HGB 10.5 (L) 03/15/2019   HCT 33.5 (L) 03/15/2019   MCV 85.7 03/15/2019   PLT 168 03/15/2019    Patient Active Problem List   Diagnosis Date Noted  . Chronic hypertension with superimposed preeclampsia 03/15/2019  . Abnormal antenatal ultrasound 12/14/2018  . Supervision of normal first pregnancy, antepartum 09/17/2018  . Chronic hypertension affecting pregnancy 09/17/2018  . BMI 30.0-30.9,adult 07/04/2015  . Eczema 09/22/2014  . Hypertension, essential 06/10/2014  . Function kidney decreased 05/17/2013    Assessment / Plan: 23 y.o. G1P0 at [redacted]w[redacted]d here for IOL due to cHTN with superimposed pre-eclampsia with severe features.  cHTN with superimposed pre-eclampsia with SF: On Mag; one severe range pressure requiring IV Labetalol since last check. Continues to remain asymptomatic. Will continue PO Labetalol 400 mg BID and anti-hypertensive protocol PRN. Will continue to monitor symptoms. Labor: Unchanged since last cervical exam. Will continue cervical  ripening with Cytotec and reassess in ~ 4 hours. Fetal Wellbeing: Category 1 tracing as above  Pain Control: Planning for epidural  Anticipated MOD: SVD  Vilma Meckel, MD  Family Medicine, PGY-2 03/16/2019, 5:46 PM

## 2019-03-17 LAB — COMPREHENSIVE METABOLIC PANEL
ALT: 17 U/L (ref 0–44)
AST: 24 U/L (ref 15–41)
Albumin: 2.7 g/dL — ABNORMAL LOW (ref 3.5–5.0)
Alkaline Phosphatase: 162 U/L — ABNORMAL HIGH (ref 38–126)
Anion gap: 10 (ref 5–15)
BUN: 7 mg/dL (ref 6–20)
CO2: 20 mmol/L — ABNORMAL LOW (ref 22–32)
Calcium: 7.7 mg/dL — ABNORMAL LOW (ref 8.9–10.3)
Chloride: 106 mmol/L (ref 98–111)
Creatinine, Ser: 0.88 mg/dL (ref 0.44–1.00)
GFR calc Af Amer: >60 mL/min (ref >60)
GFR calc non Af Amer: >60 mL/min (ref >60)
Glucose, Bld: 134 mg/dL — ABNORMAL HIGH (ref 70–99)
Potassium: 3.8 mmol/L (ref 3.5–5.1)
Sodium: 136 mmol/L (ref 135–145)
Total Bilirubin: 0.2 mg/dL — ABNORMAL LOW (ref 0.3–1.2)
Total Protein: 6 g/dL — ABNORMAL LOW (ref 6.5–8.1)

## 2019-03-17 LAB — CBC
HCT: 31.8 % — ABNORMAL LOW (ref 36.0–46.0)
Hemoglobin: 10.1 g/dL — ABNORMAL LOW (ref 12.0–15.0)
MCH: 27 pg (ref 26.0–34.0)
MCHC: 31.8 g/dL (ref 30.0–36.0)
MCV: 85 fL (ref 80.0–100.0)
Platelets: 175 10*3/uL (ref 150–400)
RBC: 3.74 MIL/uL — ABNORMAL LOW (ref 3.87–5.11)
RDW: 14.1 % (ref 11.5–15.5)
WBC: 14.4 10*3/uL — ABNORMAL HIGH (ref 4.0–10.5)
nRBC: 0 % (ref 0.0–0.2)

## 2019-03-17 LAB — MAGNESIUM: Magnesium: 5.4 mg/dL — ABNORMAL HIGH (ref 1.7–2.4)

## 2019-03-17 MED ORDER — OXYTOCIN 40 UNITS IN NORMAL SALINE INFUSION - SIMPLE MED
2.5000 [IU]/h | INTRAVENOUS | Status: DC
  Filled 2019-03-17: qty 1000

## 2019-03-17 MED ORDER — OXYTOCIN BOLUS FROM INFUSION: 500.0000 mL | Freq: Once | INTRAVENOUS | Status: DC

## 2019-03-17 MED ORDER — FENTANYL CITRATE (PF) 100 MCG/2ML IJ SOLN
100.0000 ug | INTRAMUSCULAR | Status: DC | PRN
  Administered 2019-03-17 (×3): 100 ug via INTRAVENOUS
  Filled 2019-03-17 (×3): qty 2

## 2019-03-17 MED ORDER — SOD CITRATE-CITRIC ACID 500-334 MG/5ML PO SOLN
30.0000 mL | ORAL | Status: DC | PRN
  Administered 2019-03-17: 30 mL via ORAL
  Filled 2019-03-17 (×2): qty 30

## 2019-03-17 MED ORDER — LIDOCAINE HCL (PF) 1 % IJ SOLN: 30.0000 mL | INTRAMUSCULAR | Status: DC | PRN

## 2019-03-17 MED ORDER — PENICILLIN G 3 MILLION UNITS IVPB - SIMPLE MED
3.0000 10*6.[IU] | INTRAVENOUS | Status: DC
  Administered 2019-03-17 – 2019-03-18 (×9): 3 10*6.[IU] via INTRAVENOUS
  Filled 2019-03-17 (×9): qty 100

## 2019-03-17 MED ORDER — ONDANSETRON HCL 4 MG/2ML IJ SOLN
4.0000 mg | Freq: Four times a day (QID) | INTRAMUSCULAR | Status: DC | PRN
  Administered 2019-03-17 – 2019-03-18 (×2): 4 mg via INTRAVENOUS
  Filled 2019-03-17: qty 2

## 2019-03-17 MED ORDER — OXYTOCIN 40 UNITS IN NORMAL SALINE INFUSION - SIMPLE MED
1.0000 m[IU]/min | INTRAVENOUS | Status: DC
  Administered 2019-03-17: 12:00:00 2 m[IU]/min via INTRAVENOUS

## 2019-03-17 MED ORDER — FLEET ENEMA 7-19 GM/118ML RE ENEM: 1.0000 | ENEMA | RECTAL | Status: DC | PRN

## 2019-03-17 MED ORDER — LACTATED RINGERS IV SOLN: 500.0000 mL | INTRAVENOUS | Status: DC | PRN

## 2019-03-17 NOTE — Progress Notes (Signed)
Interval Progress Note  Called by RN because deceleration and less variability. FB still in place, pitocin at 4. Will decrease pitocin back to 2. Bolus has already been given. Will continue to monitor closely. Currently category I tracing.  Lambert Mody. Juleen China, DO OB/GYN Fellow

## 2019-03-17 NOTE — Progress Notes (Signed)
Patient ID: Kelli Scott, female   DOB: November 02, 1995, 23 y.o.   MRN: [id number]  S/p cytotec x 5 doses now; rec'd 1st dose of PCN for GBS unknown; doing well on mag  BP 139/89 FHR 115, Cat 1 Irreg ctx Cx FT per RN  IUP@34 .5wks cHTN with severe SIPE Cx unfavorable  Will continue with cytotec placements and hopeful for cervical foley today  Myrtis Ser [facility] 03/17/2019

## 2019-03-17 NOTE — Progress Notes (Signed)
LABOR PROGRESS NOTE  Kelli Scott is a 23 y.o. G1P0 at [redacted]w[redacted]d  admitted for IOL due to severe SIPE.  Subjective: Kelli Scott is in bed resting. Kelli Scott feels tired but is otherwise doing well.   Objective: BP (!) 145/89   Pulse 80   Temp 98.6 F (37 C) (Oral)   Resp 18   Ht 5' (1.524 m)   Wt 79.4 kg   LMP 07/17/2018   SpO2 100%   BMI 34.18 kg/m  or  Vitals:   03/17/19 1535 03/17/19 1605 03/17/19 1733 03/17/19 1804  BP: 129/75 125/78  (!) 145/89  Pulse: 75 85  80  Resp:    18  Temp:   98.6 F (37 C)   TempSrc:   Oral   SpO2:      Weight:      Height:        Dilation: Fingertip Effacement (%): 50 Cervical Position: Posterior Station: -3 Presentation: Vertex Exam by:: Dr. Juleen China FHT: baseline rate 120 bpm, moderate varibility, 15 x 15 acel, no decel Toco: minimal  Labs: Lab Results  Component Value Date   WBC 14.4 (H) 03/17/2019   HGB 10.1 (L) 03/17/2019   HCT 31.8 (L) 03/17/2019   MCV 85.0 03/17/2019   PLT 175 03/17/2019    Patient Active Problem List   Diagnosis Date Noted  . Chronic hypertension with superimposed preeclampsia 03/15/2019  . Abnormal antenatal ultrasound 12/14/2018  . Supervision of normal first pregnancy, antepartum 09/17/2018  . Chronic hypertension affecting pregnancy 09/17/2018  . BMI 30.0-30.9,adult 07/04/2015  . Hypertension, essential 06/10/2014  . Function kidney decreased 05/17/2013    Assessment / Plan: 23 y.o. G1P0 at [redacted]w[redacted]d here for IOL due to severe SIPE.  Labor: Continue cervical exams for the progression of labor. Pitocin 36mU/min. FB in place. Mg++ 2g/hr.  Fetal Wellbeing:  Cat I  Pain Control:  Maternally supported. May have epidural Anticipated MOD:  Vaginal  [name], D.O. Family Medicine Resident, PGY-1 03/17/2019, 6:48 PM

## 2019-03-17 NOTE — Progress Notes (Signed)
OB/GYN Faculty Practice: Labor Progress Note  Subjective: Into room to introduce self to patient, discuss FB placement. Feeling well, just tired. Partner, Kelli Scott, will be coming back soon. Denies headaches, blurry vision. One episode of vomiting while in room.   Objective: BP 136/89   Pulse 84   Temp 98.3 F (36.8 C) (Axillary)   Resp 16   Ht 5' (1.524 m)   Wt 79.4 kg   LMP 07/17/2018   SpO2 100%   BMI 34.18 kg/m  Gen: tired-appearing, NAD Dilation: Fingertip Effacement (%): 50 Cervical Position: Posterior Station: -3 Presentation: Vertex Exam by:: Dr. Juleen China  Assessment and Plan: 23 y.o. G1P0 [redacted]w[redacted]d here for IOL for severe superimposed preeclampsia.  Labor: Induction started yesterday afternoon for worsening BP. Has received 5 doses of cytotec without much progression in cervical exam. Discuss placing FB with speculum and patient amenable. Cook FB placed, tolerated well. Will switch to pitocin, stopping at 53mU/min until FB out.  -- pain control: open to options -- PPH Risk: medium   Severe Superimposed Preeclampsia: Asymptomatic at this time. BP now in normal to moderate range. Has not need any prn IV anti-hypertensives in some time. Repeated PIH labs this morning and stable. Adequate UOP, no signs of Mg++ toxicity.  -- continue labetalol 600mg  BID -- continue Mg++ -- continue to monitor closely   Fetal Well-Being: EFW 2186g (18%) at 34w3. Cephalic by sutures.  -- Category I - continuous fetal monitoring  -- GBS unknown, culture pending - PCN   -- BMZ x 2 (7/20-7/21)  Keelynn Furgerson S. Juleen China, DO OB/GYN Fellow, Faculty Practice  11:51 AM

## 2019-03-17 NOTE — Progress Notes (Addendum)
Patient ID: Kelli Scott, female   DOB: 07/12/1996, 23 y.o.   MRN: [id number]  Strip note; s/p cytotec x 3 vag doses and 1 x buccal dose; due for the next dose ~ 0520  BP 145/90, 138/88 FHR 120s, occ variables, times of Cat 1 Ctx irreg Cx C/long at last exam  IUP@34 .5wks cHTN with severe SIPE (BPs, labs) Unfavorable cx  Continue cytotec dosing; attempt cervical foley today if able  Myrtis Ser CNM 03/17/2019 4:44 AM

## 2019-03-18 ENCOUNTER — Encounter (HOSPITAL_COMMUNITY)
Admission: AD | Disposition: A | Discharge status: Home / Self Care | Payer: Self-pay | Source: Home / Self Care | Attending: Obstetrics and Gynecology

## 2019-03-18 ENCOUNTER — Inpatient Hospital Stay (HOSPITAL_COMMUNITY): Payer: Medicaid Other | Admitting: Anesthesiology

## 2019-03-18 ENCOUNTER — Encounter: Payer: Medicaid Other | Admitting: Family Medicine

## 2019-03-18 DIAGNOSIS — Z98891 History of uterine scar from previous surgery: Secondary | ICD-10-CM

## 2019-03-18 DIAGNOSIS — Z3A34 34 weeks gestation of pregnancy: Secondary | ICD-10-CM

## 2019-03-18 DIAGNOSIS — O114 Pre-existing hypertension with pre-eclampsia, complicating childbirth: Secondary | ICD-10-CM

## 2019-03-18 LAB — COMPREHENSIVE METABOLIC PANEL
ALT: 22 U/L (ref 0–44)
AST: 28 U/L (ref 15–41)
Albumin: 2.6 g/dL — ABNORMAL LOW (ref 3.5–5.0)
Alkaline Phosphatase: 170 U/L — ABNORMAL HIGH (ref 38–126)
Anion gap: 8 (ref 5–15)
BUN: 8 mg/dL (ref 6–20)
CO2: 23 mmol/L (ref 22–32)
Calcium: 6.8 mg/dL — ABNORMAL LOW (ref 8.9–10.3)
Chloride: 106 mmol/L (ref 98–111)
Creatinine, Ser: 0.82 mg/dL (ref 0.44–1.00)
GFR calc Af Amer: >60 mL/min (ref >60)
GFR calc non Af Amer: >60 mL/min (ref >60)
Glucose, Bld: 84 mg/dL (ref 70–99)
Potassium: 3.7 mmol/L (ref 3.5–5.1)
Sodium: 137 mmol/L (ref 135–145)
Total Bilirubin: 0.1 mg/dL — ABNORMAL LOW (ref 0.3–1.2)
Total Protein: 5.6 g/dL — ABNORMAL LOW (ref 6.5–8.1)

## 2019-03-18 LAB — CBC
HCT: 32.5 % — ABNORMAL LOW (ref 36.0–46.0)
HCT: 34.1 % — ABNORMAL LOW (ref 36.0–46.0)
Hemoglobin: 10.1 g/dL — ABNORMAL LOW (ref 12.0–15.0)
Hemoglobin: 10.6 g/dL — ABNORMAL LOW (ref 12.0–15.0)
MCH: 26.7 pg (ref 26.0–34.0)
MCH: 26.7 pg (ref 26.0–34.0)
MCHC: 31.1 g/dL (ref 30.0–36.0)
MCHC: 31.1 g/dL (ref 30.0–36.0)
MCV: 86 fL (ref 80.0–100.0)
MCV: 85.9 fL (ref 80.0–100.0)
Platelets: 170 10*3/uL (ref 150–400)
Platelets: 166 10*3/uL (ref 150–400)
RBC: 3.78 MIL/uL — ABNORMAL LOW (ref 3.87–5.11)
RBC: 3.97 MIL/uL (ref 3.87–5.11)
RDW: 14.1 % (ref 11.5–15.5)
RDW: 14.2 % (ref 11.5–15.5)
WBC: 14.1 10*3/uL — ABNORMAL HIGH (ref 4.0–10.5)
WBC: 13.3 10*3/uL — ABNORMAL HIGH (ref 4.0–10.5)
nRBC: 0 % (ref 0.0–0.2)
nRBC: 0 % (ref 0.0–0.2)

## 2019-03-18 LAB — CULTURE, BETA STREP (GROUP B ONLY)

## 2019-03-18 LAB — OB RESULTS CONSOLE GBS: GBS: POSITIVE

## 2019-03-18 LAB — MAGNESIUM: Magnesium: 6.1 mg/dL (ref 1.7–2.4)

## 2019-03-18 SURGERY — Surgical Case
Anesthesia: Epidural

## 2019-03-18 MED ORDER — CEFAZOLIN SODIUM-DEXTROSE 2-4 GM/100ML-% IV SOLN
INTRAVENOUS | Status: AC
  Filled 2019-03-18: qty 100

## 2019-03-18 MED ORDER — SODIUM CHLORIDE 0.9 % IV SOLN
INTRAVENOUS | Status: AC
  Filled 2019-03-18: qty 500

## 2019-03-18 MED ORDER — LIDOCAINE-EPINEPHRINE (PF) 2 %-1:200000 IJ SOLN
INTRAMUSCULAR | Status: AC
  Filled 2019-03-18: qty 10

## 2019-03-18 MED ORDER — OXYCODONE-ACETAMINOPHEN 5-325 MG PO TABS: 1.0000 | ORAL_TABLET | ORAL | Status: DC | PRN

## 2019-03-18 MED ORDER — MEPERIDINE HCL 25 MG/ML IJ SOLN: 6.2500 mg | INTRAMUSCULAR | Status: DC | PRN

## 2019-03-18 MED ORDER — FENTANYL-BUPIVACAINE-NACL 0.5-0.125-0.9 MG/250ML-% EP SOLN
12.0000 mL/h | EPIDURAL | Status: DC | PRN
  Filled 2019-03-18: qty 250

## 2019-03-18 MED ORDER — WITCH HAZEL-GLYCERIN EX PADS: 1.0000 "application " | MEDICATED_PAD | CUTANEOUS | Status: DC | PRN

## 2019-03-18 MED ORDER — ZOLPIDEM TARTRATE 5 MG PO TABS: 5.0000 mg | ORAL_TABLET | Freq: Every evening | ORAL | Status: DC | PRN

## 2019-03-18 MED ORDER — SENNOSIDES-DOCUSATE SODIUM 8.6-50 MG PO TABS
2.0000 | ORAL_TABLET | ORAL | Status: DC
  Administered 2019-03-20: 2 via ORAL
  Filled 2019-03-18 (×4): qty 2

## 2019-03-18 MED ORDER — BUPIVACAINE HCL (PF) 0.25 % IJ SOLN
INTRAMUSCULAR | Status: AC
  Filled 2019-03-18: qty 30

## 2019-03-18 MED ORDER — MAGNESIUM SULFATE 40 G IN LACTATED RINGERS - SIMPLE
2.0000 g/h | INTRAVENOUS | Status: AC
  Administered 2019-03-19: 2 g/h via INTRAVENOUS
  Filled 2019-03-18: qty 500

## 2019-03-18 MED ORDER — ACETAMINOPHEN 10 MG/ML IV SOLN
1000.0000 mg | Freq: Four times a day (QID) | INTRAVENOUS | Status: DC
  Filled 2019-03-18 (×3): qty 100

## 2019-03-18 MED ORDER — ONDANSETRON HCL 4 MG/2ML IJ SOLN
INTRAMUSCULAR | Status: AC
  Filled 2019-03-18: qty 2

## 2019-03-18 MED ORDER — SODIUM CHLORIDE 0.9 % IV SOLN
INTRAVENOUS | Status: DC | PRN
  Administered 2019-03-18: 40 [IU] via INTRAVENOUS

## 2019-03-18 MED ORDER — OXYCODONE HCL 5 MG PO TABS: 5.0000 mg | ORAL_TABLET | Freq: Once | ORAL | Status: DC | PRN

## 2019-03-18 MED ORDER — FENTANYL CITRATE (PF) 100 MCG/2ML IJ SOLN
INTRAMUSCULAR | Status: DC | PRN
  Administered 2019-03-18: 50 ug via INTRAVENOUS
  Administered 2019-03-18: 100 ug via EPIDURAL
  Administered 2019-03-18: 50 ug via INTRAVENOUS

## 2019-03-18 MED ORDER — SODIUM CHLORIDE 0.9% FLUSH: 3.0000 mL | INTRAVENOUS | Status: DC | PRN

## 2019-03-18 MED ORDER — DEXAMETHASONE SODIUM PHOSPHATE 10 MG/ML IJ SOLN
INTRAMUSCULAR | Status: AC
  Filled 2019-03-18: qty 1

## 2019-03-18 MED ORDER — CEFAZOLIN SODIUM-DEXTROSE 2-3 GM-%(50ML) IV SOLR
INTRAVENOUS | Status: DC | PRN
  Administered 2019-03-18: 2 g via INTRAVENOUS

## 2019-03-18 MED ORDER — LABETALOL HCL 5 MG/ML IV SOLN: 20.0000 mg | INTRAVENOUS | Status: DC | PRN

## 2019-03-18 MED ORDER — ACETAMINOPHEN 325 MG PO TABS
650.0000 mg | ORAL_TABLET | Freq: Four times a day (QID) | ORAL | Status: DC | PRN
  Administered 2019-03-18: 04:00:00 650 mg via ORAL

## 2019-03-18 MED ORDER — LACTATED RINGERS IV SOLN
INTRAVENOUS | Status: DC | PRN
  Administered 2019-03-18 (×2): via INTRAVENOUS

## 2019-03-18 MED ORDER — ACETAMINOPHEN 500 MG PO TABS
1000.0000 mg | ORAL_TABLET | Freq: Four times a day (QID) | ORAL | Status: DC
  Administered 2019-03-19 – 2019-03-22 (×13): 1000 mg via ORAL
  Filled 2019-03-18 (×14): qty 2

## 2019-03-18 MED ORDER — EPHEDRINE 5 MG/ML INJ: 10.0000 mg | INTRAVENOUS | Status: DC | PRN

## 2019-03-18 MED ORDER — DIPHENHYDRAMINE HCL 25 MG PO CAPS: 25.0000 mg | ORAL_CAPSULE | Freq: Four times a day (QID) | ORAL | Status: DC | PRN

## 2019-03-18 MED ORDER — ACETAMINOPHEN 325 MG PO TABS
ORAL_TABLET | ORAL | Status: AC
  Filled 2019-03-18: qty 2

## 2019-03-18 MED ORDER — MEASLES, MUMPS & RUBELLA VAC IJ SOLR: 0.5000 mL | Freq: Once | INTRAMUSCULAR | Status: DC

## 2019-03-18 MED ORDER — LIDOCAINE-EPINEPHRINE (PF) 2 %-1:200000 IJ SOLN
INTRAMUSCULAR | Status: DC | PRN
  Administered 2019-03-18: 2 mL via EPIDURAL
  Administered 2019-03-18 (×2): 5 mL via EPIDURAL
  Administered 2019-03-18: 3 mL via EPIDURAL
  Administered 2019-03-18: 5 mL via EPIDURAL

## 2019-03-18 MED ORDER — ACETAMINOPHEN 160 MG/5ML PO SOLN: 325.0000 mg | ORAL | Status: DC | PRN

## 2019-03-18 MED ORDER — CEFAZOLIN SODIUM-DEXTROSE 2-4 GM/100ML-% IV SOLN: 2.0000 g | INTRAVENOUS | Status: DC

## 2019-03-18 MED ORDER — MENTHOL 3 MG MT LOZG: 1.0000 | LOZENGE | OROMUCOSAL | Status: DC | PRN

## 2019-03-18 MED ORDER — OXYTOCIN 40 UNITS IN NORMAL SALINE INFUSION - SIMPLE MED
INTRAVENOUS | Status: AC
  Filled 2019-03-18: qty 1000

## 2019-03-18 MED ORDER — HYDRALAZINE HCL 20 MG/ML IJ SOLN
10.0000 mg | INTRAMUSCULAR | Status: DC | PRN
  Administered 2019-03-18: 10 mg via INTRAVENOUS

## 2019-03-18 MED ORDER — KETOROLAC TROMETHAMINE 30 MG/ML IJ SOLN: 30.0000 mg | Freq: Four times a day (QID) | INTRAMUSCULAR | Status: DC | PRN

## 2019-03-18 MED ORDER — OXYCODONE-ACETAMINOPHEN 5-325 MG PO TABS
2.0000 | ORAL_TABLET | Freq: Once | ORAL | Status: AC
  Administered 2019-03-18: 09:00:00 2 via ORAL
  Filled 2019-03-18: qty 2

## 2019-03-18 MED ORDER — DIBUCAINE (PERIANAL) 1 % EX OINT: 1.0000 "application " | TOPICAL_OINTMENT | CUTANEOUS | Status: DC | PRN

## 2019-03-18 MED ORDER — HYDRALAZINE HCL 20 MG/ML IJ SOLN
5.0000 mg | INTRAMUSCULAR | Status: DC | PRN
  Administered 2019-03-18: 18:00:00 5 mg via INTRAVENOUS
  Filled 2019-03-18: qty 1

## 2019-03-18 MED ORDER — OXYCODONE HCL 5 MG/5ML PO SOLN: 5.0000 mg | Freq: Once | ORAL | Status: DC | PRN

## 2019-03-18 MED ORDER — OXYTOCIN 40 UNITS IN NORMAL SALINE INFUSION - SIMPLE MED: 2.5000 [IU]/h | INTRAVENOUS | Status: AC

## 2019-03-18 MED ORDER — PRENATAL MULTIVITAMIN CH
1.0000 | ORAL_TABLET | Freq: Every day | ORAL | Status: DC
  Administered 2019-03-19 – 2019-03-21 (×3): 1 via ORAL
  Filled 2019-03-18 (×3): qty 1

## 2019-03-18 MED ORDER — TETANUS-DIPHTH-ACELL PERTUSSIS 5-2.5-18.5 LF-MCG/0.5 IM SUSP: 0.5000 mL | Freq: Once | INTRAMUSCULAR | Status: DC

## 2019-03-18 MED ORDER — DEXAMETHASONE SODIUM PHOSPHATE 10 MG/ML IJ SOLN
INTRAMUSCULAR | Status: DC | PRN
  Administered 2019-03-18: 10 mg via INTRAVENOUS

## 2019-03-18 MED ORDER — LABETALOL HCL 5 MG/ML IV SOLN: 40.0000 mg | INTRAVENOUS | Status: DC | PRN

## 2019-03-18 MED ORDER — BUPIVACAINE HCL (PF) 0.25 % IJ SOLN
INTRAMUSCULAR | Status: DC | PRN
  Administered 2019-03-18: 20 mL

## 2019-03-18 MED ORDER — FENTANYL-BUPIVACAINE-NACL 0.5-0.125-0.9 MG/250ML-% EP SOLN: 12.0000 mL/h | EPIDURAL | Status: DC | PRN

## 2019-03-18 MED ORDER — NALOXONE HCL 0.4 MG/ML IJ SOLN: 0.4000 mg | INTRAMUSCULAR | Status: DC | PRN

## 2019-03-18 MED ORDER — SIMETHICONE 80 MG PO CHEW
80.0000 mg | CHEWABLE_TABLET | ORAL | Status: DC
  Administered 2019-03-19 – 2019-03-21 (×4): 80 mg via ORAL
  Filled 2019-03-18 (×4): qty 1

## 2019-03-18 MED ORDER — PHENYLEPHRINE 40 MCG/ML (10ML) SYRINGE FOR IV PUSH (FOR BLOOD PRESSURE SUPPORT)
80.0000 ug | PREFILLED_SYRINGE | INTRAVENOUS | Status: DC | PRN
  Filled 2019-03-18: qty 10

## 2019-03-18 MED ORDER — ACETAMINOPHEN 325 MG PO TABS: 325.0000 mg | ORAL_TABLET | ORAL | Status: DC | PRN

## 2019-03-18 MED ORDER — SODIUM CHLORIDE 0.9 % IV SOLN
500.0000 mg | Freq: Once | INTRAVENOUS | Status: AC
  Administered 2019-03-18: 500 mg via INTRAVENOUS

## 2019-03-18 MED ORDER — LIDOCAINE HCL (PF) 1 % IJ SOLN
INTRAMUSCULAR | Status: DC | PRN
  Administered 2019-03-18 (×2): 4 mL via EPIDURAL

## 2019-03-18 MED ORDER — ONDANSETRON HCL 4 MG/2ML IJ SOLN: 4.0000 mg | Freq: Three times a day (TID) | INTRAMUSCULAR | Status: DC | PRN

## 2019-03-18 MED ORDER — SIMETHICONE 80 MG PO CHEW: 80.0000 mg | CHEWABLE_TABLET | ORAL | Status: DC | PRN

## 2019-03-18 MED ORDER — MORPHINE SULFATE (PF) 0.5 MG/ML IJ SOLN
INTRAMUSCULAR | Status: DC | PRN
  Administered 2019-03-18: 3 mg via EPIDURAL

## 2019-03-18 MED ORDER — DIPHENHYDRAMINE HCL 50 MG/ML IJ SOLN: 12.5000 mg | INTRAMUSCULAR | Status: DC | PRN

## 2019-03-18 MED ORDER — LACTATED RINGERS IV SOLN: 500.0000 mL | Freq: Once | INTRAVENOUS | Status: DC

## 2019-03-18 MED ORDER — KETAMINE HCL 50 MG/5ML IJ SOSY
PREFILLED_SYRINGE | INTRAMUSCULAR | Status: AC
  Filled 2019-03-18: qty 5

## 2019-03-18 MED ORDER — FENTANYL CITRATE (PF) 100 MCG/2ML IJ SOLN: 25.0000 ug | INTRAMUSCULAR | Status: DC | PRN

## 2019-03-18 MED ORDER — MORPHINE SULFATE (PF) 0.5 MG/ML IJ SOLN
INTRAMUSCULAR | Status: AC
  Filled 2019-03-18: qty 10

## 2019-03-18 MED ORDER — FENTANYL CITRATE (PF) 100 MCG/2ML IJ SOLN
INTRAMUSCULAR | Status: AC
  Filled 2019-03-18: qty 2

## 2019-03-18 MED ORDER — COCONUT OIL OIL
1.0000 "application " | TOPICAL_OIL | Status: DC | PRN
  Administered 2019-03-19: 1 via TOPICAL

## 2019-03-18 MED ORDER — SIMETHICONE 80 MG PO CHEW
80.0000 mg | CHEWABLE_TABLET | Freq: Three times a day (TID) | ORAL | Status: DC
  Administered 2019-03-19 – 2019-03-22 (×8): 80 mg via ORAL
  Filled 2019-03-18 (×8): qty 1

## 2019-03-18 MED ORDER — LACTATED RINGERS IV SOLN
INTRAVENOUS | Status: DC
  Administered 2019-03-19: 11:00:00 via INTRAVENOUS

## 2019-03-18 MED ORDER — SOD CITRATE-CITRIC ACID 500-334 MG/5ML PO SOLN
30.0000 mL | ORAL | Status: AC
  Administered 2019-03-18: 20:00:00 30 mL via ORAL

## 2019-03-18 MED ORDER — ENOXAPARIN SODIUM 40 MG/0.4ML ~~LOC~~ SOLN
40.0000 mg | SUBCUTANEOUS | Status: DC
  Administered 2019-03-19 – 2019-03-21 (×3): 40 mg via SUBCUTANEOUS
  Filled 2019-03-18 (×3): qty 0.4

## 2019-03-18 MED ORDER — ONDANSETRON HCL 4 MG/2ML IJ SOLN: 4.0000 mg | Freq: Once | INTRAMUSCULAR | Status: DC | PRN

## 2019-03-18 MED ORDER — GABAPENTIN 100 MG PO CAPS
100.0000 mg | ORAL_CAPSULE | Freq: Two times a day (BID) | ORAL | Status: DC
  Administered 2019-03-19 – 2019-03-22 (×8): 100 mg via ORAL
  Filled 2019-03-18 (×8): qty 1

## 2019-03-18 MED ORDER — SODIUM CHLORIDE 0.9 % IV SOLN
INTRAVENOUS | Status: DC | PRN
  Administered 2019-03-18: 21:00:00 via INTRAVENOUS

## 2019-03-18 MED ORDER — SODIUM CHLORIDE (PF) 0.9 % IJ SOLN
INTRAMUSCULAR | Status: DC | PRN
  Administered 2019-03-18: 12 mL/h via EPIDURAL

## 2019-03-18 MED ORDER — PHENYLEPHRINE 40 MCG/ML (10ML) SYRINGE FOR IV PUSH (FOR BLOOD PRESSURE SUPPORT): 80.0000 ug | PREFILLED_SYRINGE | INTRAVENOUS | Status: DC | PRN

## 2019-03-18 SURGICAL SUPPLY — 34 items
BENZOIN TINCTURE PRP APPL 2/3 (GAUZE/BANDAGES/DRESSINGS) ×2 IMPLANT
CANISTER SUCT 3000ML PPV (MISCELLANEOUS) ×2 IMPLANT
CHLORAPREP W/TINT 26ML (MISCELLANEOUS) ×2 IMPLANT
CLOSURE STERI STRIP 1/2 X4 (GAUZE/BANDAGES/DRESSINGS) ×2 IMPLANT
DRSG OPSITE POSTOP 4X10 (GAUZE/BANDAGES/DRESSINGS) ×2 IMPLANT
ELECT REM PT RETURN 9FT ADLT (ELECTROSURGICAL) ×2
ELECTRODE REM PT RTRN 9FT ADLT (ELECTROSURGICAL) ×1 IMPLANT
EXTRACTOR VACUUM KIWI (MISCELLANEOUS) ×2 IMPLANT
GLOVE BIOGEL PI IND STRL 7.0 (GLOVE) ×2 IMPLANT
GLOVE BIOGEL PI IND STRL 7.5 (GLOVE) ×1 IMPLANT
GLOVE BIOGEL PI INDICATOR 7.0 (GLOVE) ×2
GLOVE BIOGEL PI INDICATOR 7.5 (GLOVE) ×1
GLOVE SKINSENSE NS SZ7.0 (GLOVE) ×1
GLOVE SKINSENSE STRL SZ7.0 (GLOVE) ×1 IMPLANT
GOWN STRL REUS W/ TWL LRG LVL3 (GOWN DISPOSABLE) ×2 IMPLANT
GOWN STRL REUS W/ TWL XL LVL3 (GOWN DISPOSABLE) ×1 IMPLANT
GOWN STRL REUS W/TWL LRG LVL3 (GOWN DISPOSABLE) ×2
GOWN STRL REUS W/TWL XL LVL3 (GOWN DISPOSABLE) ×1
NS IRRIG 1000ML POUR BTL (IV SOLUTION) ×2 IMPLANT
PACK C SECTION WH (CUSTOM PROCEDURE TRAY) ×2 IMPLANT
PAD ABD 7.5X8 STRL (GAUZE/BANDAGES/DRESSINGS) ×2 IMPLANT
PAD OB MATERNITY 4.3X12.25 (PERSONAL CARE ITEMS) ×2 IMPLANT
PAD PREP 24X48 CUFFED NSTRL (MISCELLANEOUS) ×2 IMPLANT
PENCIL SMOKE EVAC W/HOLSTER (ELECTROSURGICAL) ×2 IMPLANT
STRIP CLOSURE SKIN 1/2X4 (GAUZE/BANDAGES/DRESSINGS) ×2 IMPLANT
SUT MNCRL 0 VIOLET CTX 36 (SUTURE) ×2 IMPLANT
SUT MON AB 4-0 PS1 27 (SUTURE) ×2 IMPLANT
SUT MONOCRYL 0 CTX 36 (SUTURE) ×2
SUT PLAIN 2 0 XLH (SUTURE) ×2 IMPLANT
SUT VIC AB 0 CT1 36 (SUTURE) ×4 IMPLANT
SUT VIC AB 3-0 CT1 27 (SUTURE) ×1
SUT VIC AB 3-0 CT1 TAPERPNT 27 (SUTURE) ×1 IMPLANT
TOWEL OR 17X24 6PK STRL BLUE (TOWEL DISPOSABLE) ×4 IMPLANT
WATER STERILE IRR 1000ML POUR (IV SOLUTION) ×2 IMPLANT

## 2019-03-18 NOTE — Progress Notes (Signed)
OB/GYN Faculty Practice: Labor Progress Note  Subjective: Doing well, not too uncomfortable. Just tired with long process for induction. Had headache this morning and given percocet, headache now resolved. Partner, Beverely Low, now in room.   Objective: BP (!) 154/98   Pulse 72   Temp 98.5 F (36.9 C) (Oral)   Resp 18   Ht 5' (1.524 m)   Wt 79.4 kg   LMP 07/17/2018   SpO2 100%   BMI 34.18 kg/m  Gen: tired-appearing, NAD Dilation: 4 Effacement (%): Thick Cervical Position: Posterior Station: Ballotable Presentation: Vertex Exam by:: Autry, DO  Assessment and Plan: 23 y.o. G1P0 [redacted]w[redacted]d here for IOL for severe superimposed preeclampsia.   Labor: Induction started 7/21 (about 48-hours ago) with cytotec. Received 5 doses of cytotec then FB placed yesterday morning and pitocin started around 12000 7/22 - so over 24-hours of pitocin. FB came out overnight. Rechecked and now in appropriate position to AROM. Discussed pros/cons of AROM with patient, patient amenable to both that and IUPC placement to help with titrating pitocin. AROM clear fluid.  -- pain control: open to options -- PPH Risk: medium (prolonged pitocin, Mg++)  Severe Superimposed Preeclampsia: Asymptomatic at this time. BP moderate range. Has not need any prn IV anti-hypertensives in some time. Repeated PIH labs this morning and stable. Adequate UOP, no signs of Mg++ toxicity - Mg++ level 6.1 this morning.  -- continue labetalol 600mg  BID -- continue Mg++ -- continue to monitor closely  -- daily PIH labs  Fetal Well-Being: EFW 2186g (18%) at 34w3. Cephalic by sutures.  -- Category I - continuous fetal monitoring  -- GBS positive - PCN   -- BMZ x 2 (7/20-7/21)  Scharlene Catalina S. Juleen China, DO OB/GYN Fellow, Faculty Practice  11:33 AM

## 2019-03-18 NOTE — Discharge Summary (Signed)
OB Discharge Summary     Patient Name: Kelli Scott DOB: 1996/03/20 MRN: [id number]  Date of admission: 03/15/2019 Delivering MD: [place] BingPICKENS, CHARLIE   Date of discharge: 03/22/2019  Admitting diagnosis: 34 wks Hypertension Intrauterine pregnancy: 4080w6d     Secondary diagnosis:  Principal Problem:   Chronic hypertension with superimposed preeclampsia Active Problems:   Supervision of high-risk pregnancy   BMI 30.0-30.9,adult   Function kidney decreased   Abnormal antenatal ultrasound   Status post primary low transverse cesarean section  Additional problems: None     Discharge diagnosis: Term Pregnancy Delivered and CHTN with superimposed preeclampsia                                                                                                Post partum procedures:None  Augmentation: AROM, Pitocin, Cytotec and Foley Balloon  Complications: None  Hospital course:  Induction of Labor With Cesarean Section  23 y.o. yo G1P0 at 6880w6d was admitted to the hospital 03/15/2019 for induction of labor. Patient had a labor course significant for persistent Cat II FHT with inadequate MVUs. The patient went for cesarean section due to Arrest of Dilation, and delivered a Viable infant,03/18/2019  Membrane Rupture Time/Date: 11:23 AM ,03/18/2019   Details of operation can be found in separate operative note.  Patient was on magnesium sulfate for eclampsia prophylaxis, and was started on Enalapril for BP control. Had to add HCTZ for better control given rare episode of severe range BP. She had no symptoms. Otherwise, she had an uncomplicated postpartum course. She is ambulating, tolerating a regular diet, passing flatus, and urinating well.  Patient is discharged home in stable condition on 03/22/19.                                    Physical exam  Vitals:   03/21/19 2326 03/21/19 2340 03/22/19 0030 03/22/19 0418  BP: (!) 150/111 (!) 164/104 (!) 148/80 135/84  Pulse: 83 85 (!) 109 95  Resp:  18   18  Temp:  98.6 F (37 C)  98.4 F (36.9 C)  TempSrc:  Oral  Oral  SpO2:    99%  Weight:      Height:       General: alert, cooperative and no distress Lochia: appropriate Uterine Fundus: firm Incision: Dressing is clean, dry, and intact DVT Evaluation: No evidence of DVT seen on physical exam. Negative Homan's sign. Labs: Lab Results  Component Value Date   WBC 18.3 (H) 03/19/2019   HGB 8.9 (L) 03/19/2019   HCT 28.5 (L) 03/19/2019   MCV 86.4 03/19/2019   PLT 174 03/19/2019   CMP Latest Ref Rng & Units 03/19/2019  Glucose 70 - 99 mg/dL -  BUN 6 - 20 mg/dL -  Creatinine 6.290.44 - 5.281.00 mg/dL 4.130.82  Sodium 244135 - 010145 mmol/L -  Potassium 3.5 - 5.1 mmol/L -  Chloride 98 - 111 mmol/L -  CO2 22 - 32 mmol/L -  Calcium 8.9 - 10.3 mg/dL -  Total Protein 6.5 -  8.1 g/dL -  Total Bilirubin 0.3 - 1.2 mg/dL -  Alkaline Phos 38 - 126 U/L -  AST 15 - 41 U/L -  ALT 0 - 44 U/L -    Discharge instruction: per After Visit Summary and "Baby and Me Booklet".  After visit meds:  Allergies as of 03/22/2019      Reactions   Ibuprofen       Medication List    STOP taking these medications   Doxylamine-Pyridoxine 10-10 MG Tbec Commonly known as: Diclegis   labetalol 200 MG tablet Commonly known as: NORMODYNE   sulfamethoxazole-trimethoprim 800-160 MG tablet Commonly known as: BACTRIM DS     TAKE these medications   aspirin EC 81 MG tablet Take 81 mg by mouth daily.   enalapril-hydrochlorothiazide 10-25 MG tablet Commonly known as: VASERETIC Take 1 tablet by mouth daily.   ferrous sulfate 325 (65 FE) MG tablet Take 1 tablet (325 mg total) by mouth 2 (two) times daily with a meal.   gabapentin 100 MG capsule Commonly known as: NEURONTIN Take 1 capsule (100 mg total) by mouth 2 (two) times daily.   oxyCODONE-acetaminophen 5-325 MG tablet Commonly known as: PERCOCET/ROXICET Take 1 tablet by mouth every 4 (four) hours as needed for moderate pain or severe pain.   Prenatal  Gummies/DHA & FA 0.4-32.5 MG Chew Chew by mouth.   senna-docusate 8.6-50 MG tablet Commonly known as: Senokot-S Take 2 tablets by mouth 2 (two) times daily as needed for mild constipation or moderate constipation.            Discharge Care Instructions  (From admission, onward)         Start     Ordered   03/22/19 0000  Discharge wound care:    Comments: As per discharge handout and nursing instructions   03/22/19 0725          Diet: routine diet  Activity: Advance as tolerated. Pelvic rest for 6 weeks.   Outpatient follow up:4 weeks Follow up Appt:No future appointments. Follow up Visit:No follow-ups on file.   Please schedule this patient for Postpartum visit in: 4 weeks with the following provider: Any provider For C/S patients schedule nurse incision check in weeks 2 weeks: yes High risk pregnancy complicated by: severe SIPE, hx of nephrectomy  Delivery mode:  CS Anticipated Birth Control:  other/unsure PP Procedures needed: BP check, incision check  Schedule Integrated BH visit: no   Postpartum contraception: Undecided  Newborn Data: Live born female  Birth Weight: 4 lb 11.1 oz (2130 g) APGAR: 22, 8  Newborn Delivery   Birth date/time: 03/18/2019 20:55:00 Delivery type: C-Section, Low Transverse Trial of labor: Yes C-section categorization: Primary      Baby Feeding: Breast Disposition:NICU   03/22/2019 Verita Schneiders, MD

## 2019-03-18 NOTE — Progress Notes (Signed)
LABOR PROGRESS NOTE  Kelli Scott is a 23 y.o. G1P0 at [redacted]w[redacted]d  admitted for IOL due to severe SIPE.  Subjective: She is in bed and is experiencing moderate discomfort with contractions. She is complaining of pressure in her bottom during and after contractions.  Objective: BP (!) 154/103   Pulse 67   Temp 98.5 F (36.9 C) (Oral)   Resp 18   Ht 5' (1.524 m)   Wt 79.4 kg   LMP 07/17/2018   SpO2 100%   BMI 34.18 kg/m  or  Vitals:   03/18/19 1202 03/18/19 1232 03/18/19 1332 03/18/19 1402  BP: 140/88 (!) 144/95 (!) 147/95 (!) 154/103  Pulse: 69 67 67 67  Resp:      Temp:      TempSrc:      SpO2:      Weight:      Height:         Dilation: 4 Effacement (%): 50 Cervical Position: Posterior Station: Ballotable Presentation: (Dr Kelli Scott confirmed cephalic by [place] at bedside) Exam by:: Kelli Danker, DO FHT: baseline rate 115bpm, moderate varibility, 10 x 10 acel, occasional variable decel Toco: 3-5 mins  Labs: Lab Results  Component Value Date   WBC 14.1 (H) 03/18/2019   HGB 10.1 (L) 03/18/2019   HCT 32.5 (L) 03/18/2019   MCV 86.0 03/18/2019   PLT 170 03/18/2019    Patient Active Problem List   Diagnosis Date Noted  . Chronic hypertension with superimposed preeclampsia 03/15/2019  . Abnormal antenatal ultrasound 12/14/2018  . Supervision of normal first pregnancy, antepartum 09/17/2018  . Chronic hypertension affecting pregnancy 09/17/2018  . BMI 30.0-30.9,adult 07/04/2015  . Hypertension, essential 06/10/2014  . Function kidney decreased 05/17/2013    Assessment / Plan: 23 y.o. G1P0 at [redacted]w[redacted]d here for IOL due to severe SIPE.  Labor: MVUs are 110 down from a previously 180. Cervix is unchanged since last exam. Continue with pitocin 18 mU/min. Continue time appropriate cervical exams for the progression of labor. Fetal Wellbeing: Cat I Pain Control:  Maternally supported. Will receive epidural pending labwork. Anticipated MOD: Vaginal  Kelli Scott,  D.O. Family Medicine Resident, PGY-1 03/18/2019, 4:21 PM

## 2019-03-18 NOTE — Progress Notes (Signed)
Notified Dr. Clifton James of Neonatology consult.

## 2019-03-18 NOTE — Progress Notes (Signed)
LABOR PROGRESS NOTE  Clemmie Buelna is a 23 y.o. G1P0 at [redacted]w[redacted]d  admitted for IOL due to severe SIPE.  Subjective: Strip note. Discussed plan of care with night shift RN. Late note.   Objective: BP (!) 129/99   Pulse 85   Temp 98.5 F (36.9 C) (Oral)   Resp 16   Ht 5' (1.524 m)   Wt 79.4 kg   LMP 07/17/2018   SpO2 100%   BMI 34.18 kg/m  or  Vitals:   03/18/19 0600 03/18/19 0650 03/18/19 0700 03/18/19 0800  BP: 125/82  (!) 141/85 (!) 129/99  Pulse: 83  77 85  Resp: 18  18 16   Temp:  98.5 F (36.9 C)    TempSrc:  Oral    SpO2:      Weight:      Height:        Dilation: 4 Effacement (%): 100 Cervical Position: Posterior Station: -2 Presentation: Vertex Exam by:: Armanda Heritage, RN  FHT: baseline rate 120 bpm, moderate varibility, 15 x 15 acel, no decel Toco: q2-5 min   Labs: Lab Results  Component Value Date   WBC 14.4 (H) 03/17/2019   HGB 10.1 (L) 03/17/2019   HCT 31.8 (L) 03/17/2019   MCV 85.0 03/17/2019   PLT 175 03/17/2019    Patient Active Problem List   Diagnosis Date Noted  . Chronic hypertension with superimposed preeclampsia 03/15/2019  . Abnormal antenatal ultrasound 12/14/2018  . Supervision of normal first pregnancy, antepartum 09/17/2018  . Chronic hypertension affecting pregnancy 09/17/2018  . BMI 30.0-30.9,adult 07/04/2015  . Hypertension, essential 06/10/2014  . Function kidney decreased 05/17/2013    Assessment / Plan: 23 y.o. G1P0 at [redacted]w[redacted]d here for IOL due to severe SIPE. BPs stable/moderately elevated. Continue Mag at 2g/hr.   Labor: FB out around 0400. Started to titrate Pitocin once FB out. Currently at 12 mu/min.  Fetal Wellbeing:  Cat I  Pain Control:  Maternal support. Epidural upon maternal request.  Anticipated MOD:  NSVD   Phill Myron, D.O. [facility] Family Medicine Fellow, [facility] for [facility], [facility] 03/18/2019, 8:25 AM

## 2019-03-18 NOTE — Progress Notes (Signed)
CRITICAL VALUE ALERT  Critical Value:  6.1 Mag Level Date & Time Notied:  0927 03/18/2019  Provider Notified: Ilda Basset  Orders Received/Actions taken: continue to monitor

## 2019-03-18 NOTE — Progress Notes (Signed)
L&D Note  03/18/2019 - 7:43 PM  23 y.o. G1P0 631w6d. Pregnancy complicated by: h/o partial nephrectomy, cHTN  Kelli Scott is admitted for superimposed severe pre-eclampsia (BPs)   Subjective:  No s/s of pre-eclampsia  Objective:   Vitals:   03/18/19 1830 03/18/19 1900 03/18/19 1919 03/18/19 1930  BP: (!) 142/89 130/81  136/88  Pulse: 78 79  78  Resp:  18  18  Temp:   98 F (36.7 C)   TempSrc:   Oral   SpO2:      Weight:      Height:        Current Vital Signs 24h Vital Sign Ranges  T 98 F (36.7 C) Temp  Avg: 98.3 F (36.8 C)  Min: 98 F (36.7 C)  Max: 98.5 F (36.9 C)  BP 136/88 BP  Min: 125/82  Max: 171/107  HR 78 Pulse  Avg: 74.5  Min: 64  Max: 85  RR 18 Resp  Avg: 17.9  Min: 16  Max: 18  SaO2 100 % Room Air No data recorded       24 Hour I/O Current Shift I/O  Time Ins Outs 07/22 0701 - 07/23 0700 In: 5767.4 [P.O.:2481; I.V.:2686.4] Out: 3800 [Urine:3800] 07/23 1901 - 07/24 0700 In: -  Out: 250 [Urine:250]   UOP: >16100mL/hr  FHR: 110 baseline, no accels, had run of subtle late decels but currently none, min variability Toco: q4471m Gen: NAD SVE: 4cm/thick (unchanged)  Labs:  Recent Labs  Lab 03/17/19 0913 03/18/19 0801 03/18/19 1630  WBC 14.4* 14.1* 13.3*  HGB 10.1* 10.1* 10.6*  HCT 31.8* 32.5* 34.1*  PLT 175 170 166   Recent Labs  Lab 03/15/19 1314 03/17/19 0913 03/18/19 0801  NA 134* 136 137  K 4.2 3.8 3.7  CL 106 106 106  CO2 19* 20* 23  BUN 6 7 8   CREATININE 0.71 0.88 0.82  CALCIUM 9.1 7.7* 6.8*  PROT 6.6 6.0* 5.6*  BILITOT 0.2* 0.2* 0.1*  ALKPHOS 160* 162* 170*  ALT 19 17 22   AST 33 24 28  GLUCOSE 96 134* 84   Conflict (See Lab Report): O POS/O POS Performed at [facility], [address] [place], [place] [address]  Medications Current Facility-Administered Medications  Medication Dose Route Frequency Provider Last Rate Last Dose  . acetaminophen (TYLENOL) tablet 650 mg  650 mg Oral Q6H PRN Arvilla MarketWallace, Catherine  Lauren, DO   650 mg at 03/18/19 0359  . azithromycin (ZITHROMAX) 500 mg in sodium chloride 0.9 % 250 mL IVPB  500 mg Intravenous Once Spiro BingPickens, Nashton Belson, MD      . diphenhydrAMINE (BENADRYL) injection 12.5 mg  12.5 mg Intravenous Q15 min PRN Lewie LoronGermeroth, John, MD      . ePHEDrine injection 10 mg  10 mg Intravenous PRN Lewie LoronGermeroth, John, MD      . ePHEDrine injection 10 mg  10 mg Intravenous PRN Lewie LoronGermeroth, John, MD      . fentaNYL (SUBLIMAZE) injection 100 mcg  100 mcg Intravenous Q1H PRN Cam HaiShaw, Kimberly D, CNM   100 mcg at 03/17/19 1546  . fentaNYL 2 mcg/mL w/ bupivacaine 0.125% in NS 250 mL epidural infusion ([facility]-ANES)  12 mL/hr Epidural Continuous PRN Lewie LoronGermeroth, John, MD      . hydrALAZINE (APRESOLINE) injection 5 mg  5 mg Intravenous PRN Earlene PlaterWallace, Laurel S, DO   5 mg at 03/18/19 1731   And  . hydrALAZINE (APRESOLINE) injection 10 mg  10 mg Intravenous PRN Tamera StandsWallace, Laurel S,  DO   10 mg at 03/18/19 1751   And  . labetalol (NORMODYNE) injection 20 mg  20 mg Intravenous PRN Juleen China, Laurel S, DO       And  . labetalol (NORMODYNE) injection 40 mg  40 mg Intravenous PRN Juleen China, Laurel S, DO      . labetalol (NORMODYNE) tablet 600 mg  600 mg Oral BID Serita Grammes D, CNM   600 mg at 03/18/19 0820  . lactated ringers infusion 500 mL  500 mL Intravenous Once Nolon Nations, MD      . lactated ringers infusion 500-1,000 mL  500-1,000 mL Intravenous PRN Juleen China, Laurel S, DO      . lactated ringers infusion   Intravenous Continuous Laury Deep, CNM 125 mL/hr at 03/18/19 1720    . lidocaine (PF) (XYLOCAINE) 1 % injection 30 mL  30 mL Subcutaneous PRN Juleen China, Laurel S, DO      . magnesium sulfate 40 grams in LR 500 mL OB infusion  2 g/hr Intravenous Continuous Woodroe Mode, MD 25 mL/hr at 03/18/19 1012 2 g/hr at 03/18/19 1012  . ondansetron (ZOFRAN) 8 mg in sodium chloride 0.9 % 50 mL IVPB  8 mg Intravenous Q6H PRN Chancy Milroy, MD 216 mL/hr at 03/16/19 2104 8 mg at 03/16/19 2104  . ondansetron  (ZOFRAN) injection 4 mg  4 mg Intravenous Q6H PRN Juleen China, Laurel S, DO   4 mg at 03/17/19 1149  . oxytocin (PITOCIN) IV BOLUS FROM BAG  500 mL Intravenous Once Wallace, Laurel S, DO      . oxytocin (PITOCIN) IV infusion 40 units in NS 1000 mL - Premix  2.5 Units/hr Intravenous Continuous Wallace, Laurel S, DO      . oxytocin (PITOCIN) IV infusion 40 units in NS 1000 mL - Premix  1-40 milli-units/min Intravenous Titrated Gerri Lins S, DO 30 mL/hr at 03/18/19 1803 20 milli-units/min at 03/18/19 1803  . penicillin G 3 million units in sodium chloride 0.9% 100 mL IVPB  3 Million Units Intravenous Q4H Woodroe Mode, MD 200 mL/hr at 03/18/19 1806 3 Million Units at 03/18/19 1806  . PHENYLephrine 40 mcg/ml in normal saline Adult IV Push Syringe  80 mcg Intravenous PRN Nolon Nations, MD      . sodium citrate-citric acid (ORACIT) solution 30 mL  30 mL Oral Q2H PRN Gerri Lins S, DO   30 mL at 03/17/19 2335  . sodium phosphate (FLEET) 7-19 GM/118ML enema 1 enema  1 enema Rectal PRN Juleen China, Laurel S, DO      . terbutaline (BRETHINE) injection 0.25 mg  0.25 mg Subcutaneous Once PRN Woodroe Mode, MD       Facility-Administered Medications Ordered in Other Encounters  Medication Dose Route Frequency Provider Last Rate Last Dose  . fentaNYL (SUBLIMAZE) 500 mcg, bupivacaine (SENSORCAINE-MPF) 41.67 mL in sodium chloride (PF) 0.9 % 250 mL epidural    Continuous PRN Nolon Nations, MD 12 mL/hr at 03/18/19 1705 12 mL/hr at 03/18/19 1705  . lidocaine (PF) (XYLOCAINE) 1 % injection    Anesthesia Intra-op Nolon Nations, MD   4 mL at 03/18/19 1704    Assessment & Plan:  Pt stable *IUP: category II tracing (see below) *Labor: cervix has been unchanged today and hasn't changed since AROM at noon with IUPC placement. Pt has been on pitocin during this entire time; MVUs in the high 100s to low 200s range. Given category II tracing and lack of cervical change, I told her I recommend proceeding  with  c-section for arrest of dilation. I told her that if she wanted us to recheck in 2 hours, as long as fetus stays stable, this is reasonable but I'm not confident exam would be any different. Pt amenable to proceeding with c/s. Will do azithro on top of ancef *GBS: has been getting PCN *Analgesia: epidural working well.   Kelli Scott, Jr. MD Attending Center for [facility](Faculty Practice)

## 2019-03-18 NOTE — Transfer of Care (Signed)
Immediate Anesthesia Transfer of Care Note  Patient: Kelli Scott  Procedure(s) Performed: CESAREAN SECTION (N/A )  Patient Location: PACU  Anesthesia Type:Epidural  Level of Consciousness: awake  Airway & Oxygen Therapy: Patient Spontanous Breathing  Post-op Assessment: Report given to RN and Post -op Vital signs reviewed and stable  Post vital signs: stable  Last Vitals:  Vitals Value Taken Time  BP    Temp    Pulse    Resp    SpO2      Last Pain:  Vitals:   03/18/19 1919  TempSrc: Oral  PainSc: 0-No pain      Patients Stated Pain Goal: 3 (00/17/49 4496)  Complications: No apparent anesthesia complications

## 2019-03-18 NOTE — Progress Notes (Signed)
I received a referral from pt's nurse.  Pt was grateful for the check in, but was very tired and wished to rest at this time.  I will check in a bit later to offer additional support.  [place], [place] Pager, [phone] 1:27 PM

## 2019-03-18 NOTE — Consult Note (Addendum)
Asked by Dr Ilda Basset to consult on Kelli Scott and discuss expectations for preterm delivery. Maternal hx notable for [facility] with superimposed severe preeclampasia. 34 6/7 weeks. IOL. Mom has received 2 doses of betamethasone on 7/20 and 7/21. She is currently on labetalol, magnesium sulfate, and Pen G for + GBS.  I spoke to Kelli Scott in her room early this afternoon. She was sleepy but appeared attentive. I had to stop in between conversation for visible painful contraction.  I discussed expectations for admission to NICU for 34 week preterm based on these babies' needs for gavage feeding, and some needing temp support. As this baby is almost 35 weeks, her/his risk factors are maternal tx with mag sulfate and labetalol. Effects on the baby can be transient hypotonia, slow feeding, slow GI motility, and hypoglycemia. For practical purposes, this baby will likely needs to go to NICU. I discussed expected LOS.  I also discussed breastfeeding.  Tommie Sams MD Neonatologist

## 2019-03-18 NOTE — Anesthesia Preprocedure Evaluation (Signed)
Anesthesia Evaluation  Patient identified by MRN, date of birth, ID band Patient awake    Reviewed: Allergy & Precautions, Patient's Chart, lab work & pertinent test results  Airway Mallampati: II  TM Distance: >3 FB Neck ROM: Full    Dental no notable dental hx.    Pulmonary neg pulmonary ROS, Current Smoker,    Pulmonary exam normal breath sounds clear to auscultation       Cardiovascular hypertension, negative cardio ROS Normal cardiovascular exam Rhythm:Regular Rate:Normal     Neuro/Psych negative neurological ROS  negative psych ROS   GI/Hepatic negative GI ROS, Neg liver ROS,   Endo/Other  negative endocrine ROS  Renal/GU Renal disease  negative genitourinary   Musculoskeletal negative musculoskeletal ROS (+)   Abdominal (+) + obese,   Peds negative pediatric ROS (+)  Hematology negative hematology ROS (+)   Anesthesia Other Findings   Reproductive/Obstetrics (+) Pregnancy                             Anesthesia Physical Anesthesia Plan  ASA: II  Anesthesia Plan: Epidural   Post-op Pain Management:    Induction:   PONV Risk Score and Plan:   Airway Management Planned:   Additional Equipment:   Intra-op Plan:   Post-operative Plan:   Informed Consent: I have reviewed the patients History and Physical, chart, labs and discussed the procedure including the risks, benefits and alternatives for the proposed anesthesia with the patient or authorized representative who has indicated his/her understanding and acceptance.       Plan Discussed with:   Anesthesia Plan Comments:         Anesthesia Quick Evaluation

## 2019-03-18 NOTE — Progress Notes (Signed)
Notified Dr. Juleen China of pink urine. Will continue to monitor.

## 2019-03-18 NOTE — Progress Notes (Signed)
LABOR PROGRESS NOTE  Kelli Scott is a 23 y.o. G1P0 at [redacted]w[redacted]d  admitted for IOL due to severe SIPE.  Subjective: Strip note.   Objective: BP 137/90   Pulse 74   Temp 98.3 F (36.8 C) (Oral)   Resp 18   Ht 5' (1.524 m)   Wt 79.4 kg   LMP 07/17/2018   SpO2 100%   BMI 34.18 kg/m  or  Vitals:   03/17/19 2200 03/17/19 2300 03/17/19 2332 03/18/19 0000  BP: 134/84 133/81  137/90  Pulse: 79 80  74  Resp: 18 18  18   Temp:   98.3 F (36.8 C)   TempSrc:   Oral   SpO2:      Weight:      Height:        Dilation: Fingertip Effacement (%): 50 Cervical Position: Posterior Station: -3 Presentation: Vertex Exam by:: Dr. Juleen China FHT: baseline rate 120 bpm, moderate varibility, 15 x 15 acel, no decel Toco: q5-6 min   Labs: Lab Results  Component Value Date   WBC 14.4 (H) 03/17/2019   HGB 10.1 (L) 03/17/2019   HCT 31.8 (L) 03/17/2019   MCV 85.0 03/17/2019   PLT 175 03/17/2019    Patient Active Problem List   Diagnosis Date Noted  . Chronic hypertension with superimposed preeclampsia 03/15/2019  . Abnormal antenatal ultrasound 12/14/2018  . Supervision of normal first pregnancy, antepartum 09/17/2018  . Chronic hypertension affecting pregnancy 09/17/2018  . BMI 30.0-30.9,adult 07/04/2015  . Hypertension, essential 06/10/2014  . Function kidney decreased 05/17/2013    Assessment / Plan: 23 y.o. G1P0 at [redacted]w[redacted]d here for IOL due to severe SIPE. BPs stable. Continue Mag at 2g/hr.   Labor: FB remains in place. Continue low dose Pitocin until out.  Fetal Wellbeing:  Cat I  Pain Control:  Maternal support  Anticipated MOD:  NSVD   Phill Myron, D.O. OB Family Medicine Fellow, [facility] for [facility], [facility] 03/18/2019, 12:36 AM

## 2019-03-18 NOTE — Progress Notes (Signed)
OB/GYN Faculty Practice: Labor Progress Note  Subjective: Doing well, tired but more comfortable with epidural. Denies headaches, shortness of breath, vision changes.   Objective: BP 130/81   Pulse 79   Temp 98 F (36.7 C) (Oral)   Resp 18   Ht 5' (1.524 m)   Wt 79.4 kg   LMP 07/17/2018   SpO2 100%   BMI 34.18 kg/m  Gen: well-appearing, NAD Dilation: 4 Effacement (%): 50 Cervical Position: Posterior Station: -2 Presentation: Vertex Exam by:: L Charis Juliana   Assessment and Plan: 23 y.o. G1P0 [redacted]w[redacted]d here for IOL for severe superimposed preeclampsia.   Labor: No progression since last check. Has been on pitocin for over 30 hours. Epidural now in place. IUPC without adequate contractions. Discussed with patient that given no change in cervix and still feeling firm, likely that we would offer her primary C/S for prolonged/failed induction. Additionally, FHR has been persistently category II.    -- pain control: epidural -- PPH Risk: medium (prolonged pitocin, Mg++)  Severe Superimposed Preeclampsia: Asymptomatic at this time. BP moderate range though did have some severe range pressures requiring IV hydralazine. Has not needed any prn IV anti-hypertensives in some time. Repeated PIH labs this morning and stable. Adequate UOP, no signs of Mg++ toxicity. -- continue labetalol 600mg  BID -- continue Mg++ -- continue to monitor closely -- daily PIH labs  Fetal Well-Being: [id number] (18%) at 34w3. Cephalic bysutures.  -- CategoryII intermittently - periods of minimal variability, no accelerations  -- GBSpositive - PCN  -- BMZ x 2 (7/20-7/21)  Tekisha Darcey S. Juleen China, DO OB/GYN Fellow, Faculty Practice  7:30 PM

## 2019-03-18 NOTE — Op Note (Signed)
Cesarean Section Operative Report  PATIENT: Kelli Scott  PROCEDURE DATE: 03/18/2019  PREOPERATIVE DIAGNOSES: Intrauterine pregnancy at 5854w6d weeks gestation; failure to progress: arrest of dilation and non-reassuring fetal status  POSTOPERATIVE DIAGNOSES: The same  PROCEDURE: Primary Low Transverse Cesarean Section  SURGEON:   Surgeon(s) and Role:    * Remington BingPickens, Charlie, MD - Primary    * Arvilla MarketWallace, Kyrstin Campillo Lauren, DO - Fellow - Attending   Marcy Sirenatherine Copper Basnett, DO- OB Fellow   INDICATIONS: Kelli Ramsngel Schnepp is a 23 y.o. G1P0 at 3854w6d here for cesarean section secondary to the indications listed under preoperative diagnoses; please see preoperative note for further details.  The risks of cesarean section were discussed with the patient including but were not limited to: bleeding which may require transfusion or reoperation; infection which may require antibiotics; injury to bowel, bladder, ureters or other surrounding organs; injury to the fetus; need for additional procedures including hysterectomy in the event of a life-threatening hemorrhage; placental abnormalities wth subsequent pregnancies, incisional problems, thromboembolic phenomenon and other postoperative/anesthesia complications.   The patient concurred with the proposed plan, giving informed written consent for the procedure.    FINDINGS:  Viable female infant in cephalic presentation, LOA.  Apgars 8 and 8. Weight 2130g. Clear amniotic fluid.  Intact placenta, three vessel cord.  Normal uterus, fallopian tubes and ovaries bilaterally.  ANESTHESIA: Epidural INTRAVENOUS FLUIDS: 1500 mL  ESTIMATED BLOOD LOSS: 178 mL URINE OUTPUT:  700 ml SPECIMENS: Placenta sent to pathology COMPLICATIONS: None immediate  PROCEDURE IN DETAIL:  The patient preoperatively received intravenous antibiotics and had sequential compression devices applied to her lower extremities.  She was then taken to the operating room where the epidural anesthesia was dosed up  to surgical level. She was then placed in a dorsal supine position with a leftward tilt, and prepped and draped in a sterile manner.  A foley catheter was placed into her bladder and attached to constant gravity. An additional 20 cc of 0.25% Marcaine was injected at the planned incision site to achieve adequate anesthesia.   After an adequate timeout was performed, a Pfannenstiel skin incision was made with scalpel and carried through to the underlying layer of fascia. The fascia was incised in the midline, and this incision was extended bilaterally using the Mayo scissors.  Kocher clamps were applied to the superior aspect of the fascial incision and the underlying rectus muscles were dissected off bluntly.  A similar process was carried out on the inferior aspect of the fascial incision. The rectus muscles were separated in the midline bluntly and the peritoneum was entered bluntly. Attention was turned to the lower uterine segment where a low transverse hysterotomy was made with a scalpel and extended bilaterally bluntly.  The infant was successfully delivered, the cord was clamped and cut after one minute, and the infant was handed over to the awaiting neonatology team. Uterine massage was then administered, and the placenta delivered intact with a three-vessel cord. The uterus was then cleared of clots and debris.  The hysterotomy was closed with 0 Monocryl in a running locked fashion, and an imbricating layer was also placed with 0 Monocryl.  The pelvis was cleared of all clot and debris. Hemostasis was confirmed on all surfaces.  The peritoneum was closed with a 0 Vicryl running stitch. The fascia was then closed using 0 Vicryl in a running fashion.  The subcutaneous layer was irrigated, then reapproximated with 2-0 plain gut stitches.  The skin was closed with a 4-0 Vicryl subcuticular stitch.  The patient tolerated the procedure well. Sponge, lap, instrument and needle counts were correct x 3.  She was  taken to the recovery room in stable condition.   An experienced assistant was required given the standard of surgical care given the complexity of the case.  This assistant was needed for exposure, dissection, suctioning, retraction, instrument exchange, assisting with delivery with administration of fundal pressure, and for overall help during the procedure.   Maternal Disposition: PACU - hemodynamically stable.   Infant Disposition: stable   Phill Myron, D.O. OB Fellow  03/18/2019, 9:44 PM

## 2019-03-18 NOTE — Anesthesia Procedure Notes (Signed)
Epidural Patient location during procedure: OB  Staffing Anesthesiologist: Alford Gamero, MD Performed: anesthesiologist   Preanesthetic Checklist Completed: patient identified, pre-op evaluation, timeout performed, IV checked, risks and benefits discussed and monitors and equipment checked  Epidural Patient position: sitting Prep: site prepped and draped and DuraPrep Patient monitoring: heart rate, continuous pulse ox and blood pressure Approach: midline Location: L3-L4 Injection technique: LOR air and LOR saline  Needle:  Needle type: Tuohy  Needle gauge: 17 G Needle length: 9 cm Needle insertion depth: 6 cm Catheter type: closed end flexible Catheter size: 19 Gauge Catheter at skin depth: 11 cm Test dose: negative  Assessment Sensory level: T8 Events: blood not aspirated, injection not painful, no injection resistance, negative IV test and no paresthesia  Additional Notes Reason for block:procedure for pain     

## 2019-03-19 ENCOUNTER — Encounter (HOSPITAL_COMMUNITY): Payer: Self-pay | Admitting: Obstetrics and Gynecology

## 2019-03-19 LAB — CBC
HCT: 28.5 % — ABNORMAL LOW (ref 36.0–46.0)
Hemoglobin: 8.9 g/dL — ABNORMAL LOW (ref 12.0–15.0)
MCH: 27 pg (ref 26.0–34.0)
MCHC: 31.2 g/dL (ref 30.0–36.0)
MCV: 86.4 fL (ref 80.0–100.0)
Platelets: 174 10*3/uL (ref 150–400)
RBC: 3.3 MIL/uL — ABNORMAL LOW (ref 3.87–5.11)
RDW: 14.1 % (ref 11.5–15.5)
WBC: 18.3 10*3/uL — ABNORMAL HIGH (ref 4.0–10.5)
nRBC: 0 % (ref 0.0–0.2)

## 2019-03-19 LAB — CREATININE, SERUM
Creatinine, Ser: 0.82 mg/dL (ref 0.44–1.00)
GFR calc Af Amer: >60 mL/min (ref >60)
GFR calc non Af Amer: >60 mL/min (ref >60)

## 2019-03-19 NOTE — Anesthesia Postprocedure Evaluation (Signed)
Anesthesia Post Note  Patient: Kelli Scott  Procedure(s) Performed: CESAREAN SECTION (N/A )     Patient location during evaluation: Mother Baby Anesthesia Type: Epidural Level of consciousness: awake and alert Pain management: pain level controlled Vital Signs Assessment: post-procedure vital signs reviewed and stable Respiratory status: spontaneous breathing, nonlabored ventilation and respiratory function stable Cardiovascular status: stable Postop Assessment: no headache, no backache, epidural receding, no apparent nausea or vomiting, patient able to bend at knees, adequate PO intake and able to ambulate Anesthetic complications: no    Last Vitals:  Vitals:   03/19/19 0400 03/19/19 0440  BP:  (!) 145/104  Pulse:  73  Resp: 16   Temp:    SpO2:  96%    Last Pain:  Vitals:   03/19/19 0600  TempSrc:   PainSc: 2    Pain Goal: Patients Stated Pain Goal: 3 (03/16/19 0930)                 [name]

## 2019-03-19 NOTE — Lactation Note (Signed)
This note was copied from a baby's chart. Lactation Consultation Note  Patient Name: Kelli Scott [id number] Date: 03/19/2019 Reason for consult: Initial assessment;1st time breastfeeding;Primapara;NICU baby;Infant < 6lbs  Visited with mom of a 66 hours old LPI < 5 lbs NICU female, she's a P1 and already pumping. LC noticed that the junctures on the tubing were lose and adjusted them, mom should feel the difference in her posterior pumping sessions. When [name] arrived mom was asleep, she briefly woke up and asked LC to come back at a later time.  Left NICU booklet and LC brochure in the room and made her aware of [place] OP services as well as pumping log in the booklet. Mom said she's been pumping but not getting "anything". Praised her for her efforts and let her know that pumping this early on is mainly for breast stimulation and not to get volume, and encouraged her to do so at least 8 times/24 hours. LC to follow up on mom to complete initial assessment.  Maternal Data    Feeding    Interventions Interventions: Breast feeding basics reviewed;DEBP  Lactation Tools Discussed/Used Tools: Pump Breast pump type: Double-Electric Breast Pump Pump Review: Setup, frequency, and cleaning Initiated by:: RN and MPeck (adjusted junctures on tubing) Date initiated:: 03/19/19   Consult Status Consult Status: Follow-up Date: 03/20/19 Follow-up type: In-patient    [name] [name] 03/19/2019, 12:09 PM

## 2019-03-19 NOTE — Progress Notes (Signed)
Subjective: Postpartum Day 1: Cesarean Delivery Patient reports incisional pain and tolerating PO.    Objective: Vital signs in last 24 hours: Temp:  [97.7 F (36.5 C)-98.3 F (36.8 C)] 97.7 F (36.5 C) (07/24 0803) Pulse Rate:  [64-82] 68 (07/24 0803) Resp:  [8-22] 20 (07/24 0803) BP: (101-171)/(75-109) 157/94 (07/24 0803) SpO2:  [92 %-100 %] 100 % (07/24 0803)  Physical Exam:  General: alert, cooperative and no distress Lochia: appropriate Uterine Fundus: firm Incision: no significant drainage DVT Evaluation: No evidence of DVT seen on physical exam.  Recent Labs    03/18/19 1630 03/19/19 0800  HGB 10.6* 8.9*  HCT 34.1* 28.5*    Assessment/Plan: Preeclampsia, on labetalol and magnesium sulfate Status post Cesarean section. Doing well postoperatively.  Magnesium for 24 hr postop.  Emeterio Reeve 03/19/2019, 10:32 AM

## 2019-03-19 NOTE — Progress Notes (Signed)
I offered follow up support to [facility] after delivery.  She was very tired, but stated that she is doing okay, but is eager to see her baby again.  [place], [place] Pager, [phone] 3:15 PM    03/19/19 1500  Clinical Encounter Type  Visited With Patient  Visit Type Follow-up

## 2019-03-20 ENCOUNTER — Encounter (HOSPITAL_COMMUNITY): Payer: Self-pay | Admitting: *Deleted

## 2019-03-20 MED ORDER — ENALAPRIL MALEATE 10 MG PO TABS
10.0000 mg | ORAL_TABLET | Freq: Every day | ORAL | Status: DC
  Administered 2019-03-20 – 2019-03-22 (×3): 10 mg via ORAL
  Filled 2019-03-20 (×3): qty 1

## 2019-03-20 MED ORDER — FUROSEMIDE 10 MG/ML IJ SOLN
10.0000 mg | Freq: Once | INTRAMUSCULAR | Status: AC
  Administered 2019-03-20: 18:00:00 10 mg via INTRAVENOUS
  Filled 2019-03-20: qty 2

## 2019-03-20 NOTE — Progress Notes (Signed)
Post Partum Day # 2 C section, SPEC Subjective: Kelli Scott is without complaints today. Denies HA or visual changes. Tolerating diet. Ambulating and voiding without problems. Breast pumping.  Objective: Blood pressure (!) 158/97, pulse 74, temperature 98.5 F (36.9 C), temperature source Oral, resp. rate 17, height 5' (1.524 m), weight 79.4 kg, last menstrual period 07/17/2018, SpO2 94 %, unknown if currently breastfeeding.  Physical Exam:  General: alert Lochia: appropriate Uterine Fundus: firm Incision: healing well DVT Evaluation: No evidence of DVT seen on physical exam.  Recent Labs    03/18/19 1630 03/19/19 0800  HGB 10.6* 8.9*  HCT 34.1* 28.5*    Assessment/Plan: Stable Of magnesium. BP slightly elevated. Will d/c Labetalol and start Vasotec. Lasix iv x 1 dose. Hopeful for discharge home tomorrow.    LOS: 5 days   Chancy Milroy 03/20/2019, 5:14 PM

## 2019-03-21 MED ORDER — FERROUS SULFATE 325 (65 FE) MG PO TABS
325.0000 mg | ORAL_TABLET | Freq: Every day | ORAL | Status: DC
  Administered 2019-03-22: 09:00:00 325 mg via ORAL
  Filled 2019-03-21: qty 1

## 2019-03-21 MED ORDER — HYDRALAZINE HCL 20 MG/ML IJ SOLN
10.0000 mg | Freq: Once | INTRAMUSCULAR | Status: AC
  Administered 2019-03-21: 06:00:00 10 mg via INTRAVENOUS
  Filled 2019-03-21: qty 1

## 2019-03-21 MED ORDER — NIFEDIPINE 10 MG PO CAPS
20.0000 mg | ORAL_CAPSULE | Freq: Once | ORAL | Status: AC
  Administered 2019-03-21: 20 mg via ORAL
  Filled 2019-03-21: qty 2

## 2019-03-21 NOTE — Lactation Note (Signed)
This note was copied from a baby's chart. Lactation Consultation Note  Patient Name: Kelli Scott [id number] Date: 03/21/2019 Reason for consult: Follow-up assessment;NICU baby;1st time breastfeeding;Primapara;Early term 37-38.6wks;Infant < 6lbs  Attempted to visit with mother twice, the first time on 1st floor at [facility], she wasn't in her room, and the second time in her baby's room in the NICU.  39 hours old LPI female < 5 lbs who is on 24 calorie/breastmilk. Mom is a P1 and reported (+) breast changes during the pregnancy. She participated in the [facility] program at the [facility] for this pregnancy and she doesn't have a pump at home, but is planning on getting one. Mom already pumping every 3 hours but feeling discouraged because sometimes she'll get a little over a spoon and others she won't get "anything". Explained to mom that the purpose of pumping early on was mainly for breast stimulation and not to get volume.   LC revised hand expression with mom and encouraged her to try it whenever she's not getting drops with the pump. Mom able to demonstrate and teach back. LC also assisted mom with STS care, reviewed benefits of premature milk and pumping schedule.  Feeding plan:  1. Encouraged mom to pump every 3 hours; at least 8 times/24 hours 2. She'll continue using coconut oil prior pumping 3. Mom will try hand expression with a spoon whenever she's not getting drops with the pump; will continue turning in her colostrum to the NICU  Mom reported all questions and concerns were answered, she's aware of [facility] services and will call PRN.  Maternal Data Formula Feeding for Exclusion: No Has patient been taught Hand Expression?: Yes Does the patient have breastfeeding experience prior to this delivery?: No  Feeding Feeding Type: Donor Breast Milk Nipple Type: Nfant Slow Flow (purple)  LATCH Score                   Interventions Interventions: Breast feeding basics  reviewed;Hand express;Breast compression;Breast massage  Lactation Tools Discussed/Used WIC Program: Yes   Consult Status Consult Status: PRN Follow-up type: In-patient    Kelli Scott 03/21/2019, 12:54 PM

## 2019-03-21 NOTE — Progress Notes (Signed)
Post Partum Day # 3 C section, SPEC Subjective: Had severe range BP earlier today and needed extra medication for control. Seeing her now in NICU. Np complaints today. Denies HA or visual changes. Tolerating diet. Ambulating and voiding without problems. Breast pumping. Baby is doing well.  Objective: Blood pressure (!) 159/99, pulse 97, temperature 98 F (36.7 C), temperature source Oral, resp. rate 18, height 5' (1.524 m), weight 79.4 kg, last menstrual period 07/17/2018, SpO2 100 %, unknown if currently breastfeeding. Patient Vitals for the past 24 hrs:  BP Temp Temp src Pulse Resp SpO2  03/21/19 0805 (!) 159/99 98 F (36.7 C) Oral 97 18 100 %  03/21/19 0746 (!) 150/107 - - (!) 107 - -  03/21/19 0731 (!) 145/91 - - (!) 119 - -  03/21/19 0716 (!) 148/88 - - (!) 105 - -  03/21/19 0701 (!) 145/84 - - (!) 101 - -  03/21/19 0646 (!) 151/93 - - 94 - -  03/21/19 0632 (!) 144/92 - - 92 - -  03/21/19 0616 (!) 178/102 - - 85 - -  03/21/19 0602 (!) 163/104 98.9 F (37.2 C) Oral 85 17 98 %  03/20/19 2348 (!) 142/88 99.1 F (37.3 C) Oral 78 17 99 %  03/20/19 1647 (!) 158/97 98.2 F (36.8 C) Oral 74 16 99 %  03/20/19 1627 (!) 161/97 - - 72 17 -  03/20/19 1626 - - - 74 - -   Physical Exam:  General: alert Lochia: appropriate Uterine Fundus: firm Incision: healing well DVT Evaluation: No evidence of DVT seen on physical exam.  Recent Labs    03/18/19 1630 03/19/19 0800  HGB 10.6* 8.9*  HCT 34.1* 28.5*    Assessment/Plan: Severe range BP this morning. Continue Enalapril, watch for rest of today and possible discharge tomorrow. Routine postpartum care   LOS: 6 days   Verita Schneiders, MD 03/21/2019, 2:56 PM

## 2019-03-22 ENCOUNTER — Encounter (HOSPITAL_COMMUNITY): Payer: Self-pay

## 2019-03-22 ENCOUNTER — Ambulatory Visit (HOSPITAL_COMMUNITY): Payer: Medicaid Other

## 2019-03-22 MED ORDER — FERROUS SULFATE 325 (65 FE) MG PO TABS: 325.0000 mg | ORAL_TABLET | Freq: Two times a day (BID) | ORAL | 2 refills | Status: DC

## 2019-03-22 MED ORDER — ENALAPRIL-HYDROCHLOROTHIAZIDE 10-25 MG PO TABS: 1.0000 | ORAL_TABLET | Freq: Every day | ORAL | 2 refills | Status: DC

## 2019-03-22 MED ORDER — GABAPENTIN 100 MG PO CAPS: 100.0000 mg | ORAL_CAPSULE | Freq: Two times a day (BID) | ORAL | 0 refills | Status: DC

## 2019-03-22 MED ORDER — ENALAPRIL-HYDROCHLOROTHIAZIDE 5-12.5 MG PO TABS: 2.0000 | ORAL_TABLET | Freq: Every day | ORAL | 1 refills | Status: DC

## 2019-03-22 MED ORDER — SENNOSIDES-DOCUSATE SODIUM 8.6-50 MG PO TABS: 2.0000 | ORAL_TABLET | Freq: Two times a day (BID) | ORAL | 2 refills | Status: DC | PRN

## 2019-03-22 MED ORDER — HYDROCHLOROTHIAZIDE 25 MG PO TABS
25.0000 mg | ORAL_TABLET | Freq: Every day | ORAL | Status: DC
  Administered 2019-03-22: 09:00:00 25 mg via ORAL
  Filled 2019-03-22: qty 1

## 2019-03-22 MED ORDER — OXYCODONE-ACETAMINOPHEN 5-325 MG PO TABS: 1.0000 | ORAL_TABLET | ORAL | 0 refills | Status: DC | PRN

## 2019-03-22 NOTE — Lactation Note (Signed)
This note was copied from a baby's chart. Lactation Consultation Note  Patient Name: Kelli Scott Today's Date: 03/22/2019 Reason for consult: Follow-up assessment;Late-preterm 34-36.6wks;NICU baby Baby is 34 6/7 weeks ( 35 3/7 PMA )  Mom is being D/C today LC reviewed supply and demand and the importance of being consistent with pumping  Around the clock along with hand expressing. LC also encouraged mom to pump in front of  The baby to give her more baby thoughts. Pumping goal in 24 hours 8-12 times both breast for 15 - 20 mins.  Mom denies sore nipples - sore nipple and engorgement prevention and and tx.  Per mom WIC called and left a message and she needs to call them back.  Mom has the DEBP kit packed up for D/C.  Mom aware of the LC resources if needed.      Maternal Data    Feeding   LATCH Score                   Interventions Interventions: Breast feeding basics reviewed;DEBP  Lactation Tools Discussed/Used Tools: Pump Breast pump type: Double-Electric Breast Pump WIC Program: Yes(per mom WIC called and left a message and she has to call WIC back for her DEBP loaner) Pump Review: Milk Storage(enc to plan on pumping in NICU)   Consult Status Consult Status: PRN Date: (baby is in NICU) Follow-up type: In-patient    Margaret Ann Iorio 03/22/2019, 12:38 PM    

## 2019-03-22 NOTE — Progress Notes (Signed)
Patient screened out for psychosocial assessment since none of the following apply:  Psychosocial stressors documented in mother or baby's chart  Gestation less than 32 weeks  Code at delivery   Infant with anomalies Please contact the Clinical Social Worker if specific needs arise, by MOB's request, or if MOB scores greater than 9/yes to question 10 on [place] Postpartum Depression Screen.  Diania Co, LCSW Clinical Social Worker [facility] Cell#: [phone]     

## 2019-03-22 NOTE — Progress Notes (Signed)
Pt discharged to home.  Condition stable.  PreEclampsia tear off sheet given to pt and reviewed along with her discharge instructions.  Pt verbalized understanding about her medications including medication for blood pressure and she plans to pick them all up from [facility] on [address] in [place] this afternoon.  Pt home with DEBP kit and will be getting a pump from [facility].  Pt ambulated to NICU with plans to leave from there when her boyfriend arrives to pick her up.

## 2019-03-22 NOTE — Progress Notes (Signed)
Pt c/o headache earlier this AM.  BP at that time was 144/102.  Gave scheduled Tylenol, enalapril and HcTz.  Rechecked BP at 11:22 and it was 145/91.  Woke patient up to take BP.  She said headache is still present (points to right temple).  Phoned Dr. Rip Harbour with above information.  He said we may proceed with discharge as ordered.  [facility].

## 2019-03-22 NOTE — Discharge Instructions (Signed)
Cesarean Delivery, Care After This sheet gives you information about how to care for yourself after your procedure. Your health care provider may also give you more specific instructions. If you have problems or questions, contact your health care provider. What can I expect after the procedure? After the procedure, it is common to have:  A small amount of blood or clear fluid coming from the incision.  Some redness, swelling, and pain in your incision area.  Some abdominal pain and soreness.  Vaginal bleeding (lochia). Even though you did not have a vaginal delivery, you will still have vaginal bleeding and discharge.  Pelvic cramps.  Fatigue. You may have pain, swelling, and discomfort in the tissue between your vagina and your anus (perineum) if:  Your C-section was unplanned, and you were allowed to labor and push.  An incision was made in the area (episiotomy) or the tissue tore during attempted vaginal delivery. Follow these instructions at home: Incision care   Follow instructions from your health care provider about how to take care of your incision. Make sure you: ? Wash your hands with soap and water before you change your bandage (dressing). If soap and water are not available, use hand sanitizer. ? If you have a dressing, change it or remove it as told by your health care provider. ? Leave stitches (sutures), skin staples, skin glue, or adhesive strips in place. These skin closures may need to stay in place for 2 weeks or longer. If adhesive strip edges start to loosen and curl up, you may trim the loose edges. Do not remove adhesive strips completely unless your health care provider tells you to do that.  Check your incision area every day for signs of infection. Check for: ? More redness, swelling, or pain. ? More fluid or blood. ? Warmth. ? Pus or a bad smell.  Do not take baths, swim, or use a hot tub until your health care provider says it's okay. Ask your health  care provider if you can take showers.  When you cough or sneeze, hug a pillow. This helps with pain and decreases the chance of your incision opening up (dehiscing). Do this until your incision heals. Medicines  Take over-the-counter and prescription medicines only as told by your health care provider.  If you were prescribed an antibiotic medicine, take it as told by your health care provider. Do not stop taking the antibiotic even if you start to feel better.  Do not drive or use heavy machinery while taking prescription pain medicine. Lifestyle  Do not drink alcohol. This is especially important if you are breastfeeding or taking pain medicine.  Do not use any products that contain nicotine or tobacco, such as cigarettes, e-cigarettes, and chewing tobacco. If you need help quitting, ask your health care provider. Eating and drinking  Drink at least 8 eight-ounce glasses of water every day unless told not to by your health care provider. If you breastfeed, you may need to drink even more water.  Eat high-fiber foods every day. These foods may help prevent or relieve constipation. High-fiber foods include: ? Whole grain cereals and breads. ? Brown rice. ? Beans. ? Fresh fruits and vegetables. Activity   If possible, have someone help you care for your baby and help with household activities for at least a few days after you leave the hospital.  Return to your normal activities as told by your health care provider. Ask your health care provider what activities are safe for  you.  Rest as much as possible. Try to rest or take a nap while your baby is sleeping.  Do not lift anything that is heavier than 10 lbs (4.5 kg), or the limit that you were told, until your health care provider says that it is safe.  Talk with your health care provider about when you can engage in sexual activity. This may depend on your: ? Risk of infection. ? How fast you heal. ? Comfort and desire to  engage in sexual activity. General instructions  Do not use tampons or douches until your health care provider approves.  Wear loose, comfortable clothing and a supportive and well-fitting bra.  Keep your perineum clean and dry. Wipe from front to back when you use the toilet.  If you pass a blood clot, save it and call your health care provider to discuss. Do not flush blood clots down the toilet before you get instructions from your health care provider.  Keep all follow-up visits for you and your baby as told by your health care provider. This is important. Contact a health care provider if:  You have: ? A fever. ? Bad-smelling vaginal discharge. ? Pus or a bad smell coming from your incision. ? Difficulty or pain when urinating. ? A sudden increase or decrease in the frequency of your bowel movements. ? More redness, swelling, or pain around your incision. ? More fluid or blood coming from your incision. ? A rash. ? Nausea. ? Little or no interest in activities you used to enjoy. ? Questions about caring for yourself or your baby.  Your incision feels warm to the touch.  Your breasts turn red or become painful or hard.  You feel unusually sad or worried.  You vomit.  You pass a blood clot from your vagina.  You urinate more than usual.  You are dizzy or light-headed. Get help right away if:  You have: ? Pain that does not go away or get better with medicine. ? Chest pain. ? Difficulty breathing. ? Blurred vision or spots in your vision. ? Thoughts about hurting yourself or your baby. ? New pain in your abdomen or in one of your legs. ? A severe headache.  You faint.  You bleed from your vagina so much that you fill more than one sanitary pad in one hour. Bleeding should not be heavier than your heaviest period. Summary  After the procedure, it is common to have pain at your incision site, abdominal cramping, and slight bleeding from your vagina.  Check  your incision area every day for signs of infection.  Tell your health care provider about any unusual symptoms.  Keep all follow-up visits for you and your baby as told by your health care provider. This information is not intended to replace advice given to you by your health care provider. Make sure you discuss any questions you have with your health care provider. Document Released: 05/04/2002 Document Revised: 02/18/2018 Document Reviewed: 02/18/2018 Elsevier Patient Education  2020 [employer].    Postpartum Hypertension Postpartum hypertension is high blood pressure that remains higher than normal after childbirth. You may not realize that you have postpartum hypertension if your blood pressure is not being checked regularly. In most cases, postpartum hypertension will go away on its own, usually within a week of delivery. However, for some women, medical treatment is required to prevent serious complications, such as seizures or stroke. What are the causes? This condition may be caused by one or  more of the following:  Hypertension that existed before pregnancy (chronic hypertension).  Hypertension that comes on as a result of pregnancy (gestational hypertension).  Hypertensive disorders during pregnancy (preeclampsia) or seizures in women who have high blood pressure during pregnancy (eclampsia).  A condition in which the liver, platelets, and red blood cells are damaged during pregnancy (HELLP syndrome).  A condition in which the thyroid produces too much hormones (hyperthyroidism).  Other rare problems of the nerves (neurological disorders) or blood disorders. In some cases, the cause may not be known. What increases the risk? The following factors may make you more likely to develop this condition:  Chronic hypertension. In some cases, this may not have been diagnosed before pregnancy.  Obesity.  Type 2 diabetes.  Kidney disease.  History of preeclampsia or  eclampsia.  Other medical conditions that change the level of hormones in the body (hormonal imbalance). What are the signs or symptoms? As with all types of hypertension, postpartum hypertension may not have any symptoms. Depending on how high your blood pressure is, you may experience:  Headaches. These may be mild, moderate, or severe. They may also be steady, constant, or sudden in onset (thunderclap headache).  Changes in your ability to see (visual changes).  Dizziness.  Shortness of breath.  Swelling of your hands, feet, lower legs, or face. In some cases, you may have swelling in more than one of these locations.  Heart palpitations or a racing heartbeat.  Difficulty breathing while lying down.  Decrease in the amount of urine that you pass. Other rare signs and symptoms may include:  Sweating more than usual. This lasts longer than a few days after delivery.  Chest pain.  Sudden dizziness when you get up from sitting or lying down.  Seizures.  Nausea or vomiting.  Abdominal pain. How is this diagnosed? This condition may be diagnosed based on the results of a physical exam, blood pressure measurements, and blood and urine tests. You may also have other tests, such as a CT scan or an MRI, to check for other problems of postpartum hypertension. How is this treated? If blood pressure is high enough to require treatment, your options may include:  Medicines to reduce blood pressure (antihypertensives). Tell your health care provider if you are breastfeeding or if you plan to breastfeed. There are many antihypertensive medicines that are safe to take while breastfeeding.  Stopping medicines that may be causing hypertension.  Treating medical conditions that are causing hypertension.  Treating the complications of hypertension, such as seizures, stroke, or kidney problems. Your health care provider will also continue to monitor your blood pressure closely until it is  within a safe range for you. Follow these instructions at home:  Take over-the-counter and prescription medicines only as told by your health care provider.  Return to your normal activities as told by your health care provider. Ask your health care provider what activities are safe for you.  Do not use any products that contain nicotine or tobacco, such as cigarettes and e-cigarettes. If you need help quitting, ask your health care provider.  Keep all follow-up visits as told by your health care provider. This is important. Contact a health care provider if:  Your symptoms get worse.  You have new symptoms, such as: ? A headache that does not get better. ? Dizziness. ? Visual changes. Get help right away if:  You suddenly develop swelling in your hands, ankles, or face.  You have sudden, rapid weight gain.  You develop difficulty breathing, chest pain, racing heartbeat, or heart palpitations. °· You develop severe pain in your abdomen. °· You have any symptoms of a stroke. "BE FAST" is an easy way to remember the main warning signs of a stroke: °? B - Balance. Signs are dizziness, sudden trouble walking, or loss of balance. °? E - Eyes. Signs are trouble seeing or a sudden change in vision. °? F - Face. Signs are sudden weakness or numbness of the face, or the face or eyelid drooping on one side. °? A - Arms. Signs are weakness or numbness in an arm. This happens suddenly and usually on one side of the body. °? S - Speech. Signs are sudden trouble speaking, slurred speech, or trouble understanding what people say. °? T - Time. Time to call emergency services. Write down what time symptoms started. °· You have other signs of a stroke, such as: °? A sudden, severe headache with no known cause. °? Nausea or vomiting. °? Seizure. °These symptoms may represent a serious problem that is an emergency. Do not wait to see if the symptoms will go away. Get medical help right away. Call your local  emergency services (911 in the [place] Do not drive yourself to the hospital. °Summary °· Postpartum hypertension is high blood pressure that remains higher than normal after childbirth. °· In most cases, postpartum hypertension will go away on its own, usually within a week of delivery. °· For some women, medical treatment is required to prevent serious complications, such as seizures or stroke. °This information is not intended to replace advice given to you by your health care provider. Make sure you discuss any questions you have with your health care provider. °Document Released: 04/15/2014 Document Revised: 09/18/2018 Document Reviewed: 06/02/2017 °Elsevier Patient Education © 2020 Elsevier Inc. ° ° ° °

## 2019-03-25 ENCOUNTER — Ambulatory Visit (INDEPENDENT_AMBULATORY_CARE_PROVIDER_SITE_OTHER): Payer: Medicaid Other | Admitting: Family Medicine

## 2019-03-25 ENCOUNTER — Other Ambulatory Visit: Payer: Self-pay

## 2019-03-25 ENCOUNTER — Encounter: Payer: Self-pay | Admitting: Family Medicine

## 2019-03-25 VITALS — BP 149/106 | HR 85 | Ht 60.0 in | Wt 150.0 lb

## 2019-03-25 DIAGNOSIS — O10919 Unspecified pre-existing hypertension complicating pregnancy, unspecified trimester: Secondary | ICD-10-CM

## 2019-03-25 DIAGNOSIS — Z4889 Encounter for other specified surgical aftercare: Secondary | ICD-10-CM

## 2019-03-25 MED ORDER — ENALAPRIL-HYDROCHLOROTHIAZIDE 10-25 MG PO TABS: 1.0000 | ORAL_TABLET | Freq: Every day | ORAL | 2 refills | Status: DC

## 2019-03-25 NOTE — Progress Notes (Signed)
Patient presents for incision check/bp check. Patient is one week post op c-section   Repeat BP 149/106 patient denies any headaches, dizziness or visual changes. Patient took her Bp med this morning. Kathrene Alu RN

## 2019-03-25 NOTE — Progress Notes (Signed)
   Subjective:    Patient ID: Kelli Scott, female    DOB: [birth year], 23 y.o.   MRN: [id number]  HPI Patient is 1 week postpartum from cesarean delivery. Here for wound check. No problems with incision. Pain controlled. Bleeding decreasing.   Review of Systems     Objective:   Physical Exam Constitutional:      Appearance: Normal appearance.  Abdominal:     General: Abdomen is flat. There is no distension.     Palpations: Abdomen is soft. There is no mass.     Tenderness: There is no abdominal tenderness. There is no guarding or rebound.     Hernia: No hernia is present.     Comments: Incision clean, dry, intact. Healing well.  Neurological:     Mental Status: She is alert.       Assessment & Plan:  1. Encounter for post surgical wound check Healing well.  2. HTN Increase enalapril/hctz to 10/25mg  daily.

## 2019-03-26 ENCOUNTER — Ambulatory Visit: Payer: Medicaid Other | Admitting: Obstetrics & Gynecology

## 2019-04-16 ENCOUNTER — Telehealth: Payer: Self-pay

## 2019-04-16 ENCOUNTER — Other Ambulatory Visit: Payer: Self-pay | Admitting: Family Medicine

## 2019-04-16 MED ORDER — ENALAPRIL-HYDROCHLOROTHIAZIDE 10-25 MG PO TABS: 1.0000 | ORAL_TABLET | Freq: Every day | ORAL | 0 refills | Status: DC

## 2019-04-16 NOTE — Telephone Encounter (Signed)
-----   Message from Truett Mainland, DO sent at 04/16/2019  8:30 AM EDT ----- Regarding: RE: Medication refill I have refilled it - she only needs to take 1 tab a day. She needs to find a PCP to take over prescribing this.  ----- Message ----- From: Valentina Lucks, CMA Sent: 04/15/2019   4:11 PM EDT To: Truett Mainland, DO Subject: Medication refill                              Pt called stating she needs a refill of enalapril-hydrochlorothiazide. She had 30 days supply but she took 2 tabs daily and is unable to refill because it's too early.

## 2019-04-16 NOTE — Telephone Encounter (Signed)
Pt made aware that her BP med has been refilled. Pt also made aware that she will need to find a PCP for her BP. Understanding was voiced. Kelli Scott, CMA

## 2019-04-23 NOTE — Progress Notes (Unsigned)
FMLA complete °

## 2019-04-26 ENCOUNTER — Encounter: Payer: Self-pay | Admitting: Obstetrics & Gynecology

## 2019-04-26 ENCOUNTER — Other Ambulatory Visit: Payer: Self-pay

## 2019-04-26 ENCOUNTER — Ambulatory Visit (INDEPENDENT_AMBULATORY_CARE_PROVIDER_SITE_OTHER): Payer: Medicaid Other | Admitting: Obstetrics & Gynecology

## 2019-04-26 VITALS — BP 116/71 | HR 78 | Ht 60.0 in | Wt 141.1 lb

## 2019-04-26 DIAGNOSIS — Z3042 Encounter for surveillance of injectable contraceptive: Secondary | ICD-10-CM | POA: Diagnosis not present

## 2019-04-26 DIAGNOSIS — F418 Other specified anxiety disorders: Secondary | ICD-10-CM

## 2019-04-26 DIAGNOSIS — Z029 Encounter for administrative examinations, unspecified: Secondary | ICD-10-CM

## 2019-04-26 DIAGNOSIS — O99345 Other mental disorders complicating the puerperium: Secondary | ICD-10-CM

## 2019-04-26 MED ORDER — MEDROXYPROGESTERONE ACETATE 150 MG/ML IM SUSP
150.0000 mg | Freq: Once | INTRAMUSCULAR | Status: AC
  Administered 2019-04-26: 150 mg via INTRAMUSCULAR

## 2019-04-26 NOTE — Progress Notes (Signed)
Subjective:     Cloma Rahrig is a 23 y.o. female who presents for a postpartum visit. She is 5 weeks postpartum following a low cervical transverse Cesarean section. I have fully reviewed the prenatal and intrapartum course. The delivery was at 34 gestational weeks. Outcome: primary cesarean section, low transverse incision. Anesthesia: epidural. Postpartum course has been uncomplicated. Baby's course has been unremarkable. He is home and nursing without difficulty. Baby is feeding by both breast and bottle - Similac Neosure. Bleeding no bleeding. Bowel function is normal. Bladder function is normal. Patient is not sexually active. Contraception method is none. Postpartum depression screening: negative. 8 but, pt reports that she is not doing well. She feels that she does not have help with the baby and feels that her parnter makes her feel guilty when she requests assistance.     The following portions of the patient's history were reviewed and updated as appropriate: allergies, current medications, past family history, past medical history, past social history, past surgical history and problem list.  Review of Systems Pertinent items are noted in HPI.   Objective:    Wt 141 lb 1.9 oz (64 kg)   BMI 27.56 kg/m   BP 116/71   Pulse 78   Ht 5' (1.524 m)   Wt 141 lb 1.9 oz (64 kg)   BMI 27.56 kg/m   CONSTITUTIONAL: Well-developed, well-nourished female in no acute distress.  HENT:  Normocephalic, atraumatic EYES: Conjunctivae and EOM are normal. No scleral icterus.  NECK: Normal range of motion SKIN: Skin is warm and dry. No rash noted. Not diaphoretic.No pallor. [place]: Alert and oriented to person, place, and time. Normal coordination.        Assessment:    5 weeks postpartum exam. Pap smear not done at today's visit.   H/o preeclampsia On Enalapril/ HCTZ - BP now WNL  Reviewed all forms of birth control options available including abstinence; over the counter/barrier methods;  hormonal contraceptive medication including pill, patch, ring, injection,contraceptive implant; hormonal and nonhormonal IUDs; permanent sterilization options including vasectomy and the various tubal sterilization modalities. Risks and benefits reviewed.  Questions were answered.  Information was given to patient to review.   Family adjustment issues  Plan:    1. Contraception: Depo-Provera injections 2. Stop BP meds  3. Follow up in: 4 weeks or as needed. Repeat BP at that time 4. F/u in 12 weeks for next Depo Provera injection 5. Referral to [place]. Harraway-Eisenbeis, M.D., Cherlynn June

## 2019-05-04 NOTE — BH Specialist Note (Signed)
Pt needs to reschedule, as she was unaware of visit today.   [facility] via Telemedicine Video Visit  05/04/2019 Kelli Scott [phone]   Number of [place] visits: *Caroleen Hamman [facility]

## 2019-05-05 ENCOUNTER — Other Ambulatory Visit: Payer: Self-pay

## 2019-05-05 ENCOUNTER — Telehealth: Payer: Medicaid Other | Admitting: Clinical

## 2019-05-10 ENCOUNTER — Other Ambulatory Visit: Payer: Self-pay

## 2019-05-10 ENCOUNTER — Telehealth: Payer: Self-pay | Admitting: Obstetrics & Gynecology

## 2019-05-10 ENCOUNTER — Telehealth: Payer: Self-pay | Admitting: Clinical

## 2019-05-10 DIAGNOSIS — Z5329 Procedure and treatment not carried out because of patient's decision for other reasons: Secondary | ICD-10-CM

## 2019-05-10 DIAGNOSIS — Z91199 Patient's noncompliance with other medical treatment and regimen due to unspecified reason: Secondary | ICD-10-CM

## 2019-05-10 NOTE — BH Specialist Note (Signed)
Pt did not arrive to video visit, and did not answer the phone; Left HIPPA-compliant message to call back [name] from [facility] at [phone].    [name] via Telemedicine Video Visit  05/10/2019 Kelli Scott [phone]  [name]

## 2019-05-12 ENCOUNTER — Other Ambulatory Visit: Payer: Self-pay | Admitting: Obstetrics & Gynecology

## 2019-05-12 ENCOUNTER — Telehealth: Payer: Self-pay

## 2019-05-12 DIAGNOSIS — N921 Excessive and frequent menstruation with irregular cycle: Secondary | ICD-10-CM

## 2019-05-12 MED ORDER — NORETHIN ACE-ETH ESTRAD-FE 1-20 MG-MCG(24) PO TABS: 1.0000 | ORAL_TABLET | Freq: Every day | ORAL | 3 refills | Status: DC

## 2019-05-12 NOTE — Telephone Encounter (Signed)
Opened in error

## 2019-05-12 NOTE — Telephone Encounter (Signed)
Patient called stating that her period has been heavy for the last two weeks. She has also had cramping. Patient states that she received Depo at last visit, Aug. 31,2020 and has been bleeding since. Patient wondering if there is any medication she can take to help stop the bleeding. Will route to provider. Kathrene Alu RN

## 2019-05-13 NOTE — Progress Notes (Signed)
Left message for patient to return call to office. Mckinnley Smithey  RN 

## 2019-05-13 NOTE — Progress Notes (Signed)
Patient called and states she is using tampons for the bleeding. Patient states she used a whole box in last two days (18 ct). Patient made aware that Dr. Ihor Dow had prescribed birth control pills to help stop the bleeding. She needs to call back if her symptoms are not relieved after one week of use. Kathrene Alu RN

## 2019-05-24 ENCOUNTER — Ambulatory Visit: Payer: Medicaid Other | Admitting: Family Medicine

## 2019-06-28 ENCOUNTER — Other Ambulatory Visit: Payer: Self-pay

## 2019-06-28 MED ORDER — MEDROXYPROGESTERONE ACETATE 150 MG/ML IM SUSP: 150.0000 mg | INTRAMUSCULAR | 0 refills | Status: DC

## 2019-06-28 NOTE — Telephone Encounter (Signed)
Patient called to double check her depo provera appointment. Patient given appointment date and made aware that her depo provera has been sent to her pharmacy for pick up before that appointment to bring with her.patient states understanding. Kathrene Alu RN

## 2019-07-12 ENCOUNTER — Ambulatory Visit (INDEPENDENT_AMBULATORY_CARE_PROVIDER_SITE_OTHER): Payer: Medicaid Other

## 2019-07-12 ENCOUNTER — Ambulatory Visit: Payer: Medicaid Other

## 2019-07-12 ENCOUNTER — Other Ambulatory Visit: Payer: Self-pay

## 2019-07-12 VITALS — BP 148/105 | HR 75 | Ht 58.27 in | Wt 145.0 lb

## 2019-07-12 DIAGNOSIS — Z3042 Encounter for surveillance of injectable contraceptive: Secondary | ICD-10-CM

## 2019-07-12 MED ORDER — MEDROXYPROGESTERONE ACETATE 150 MG/ML IM SUSP
150.0000 mg | Freq: Once | INTRAMUSCULAR | Status: AC
  Administered 2019-07-12: 10:00:00 150 mg via INTRAMUSCULAR

## 2019-07-12 NOTE — Progress Notes (Signed)
Kelli Scott here for Depo-Provera  Injection.  Injection administered without complication. Patient will return in 3 months for next injection.  chiquita l wilson, CMA 07/12/2019  10:26 AM

## 2019-07-13 NOTE — Progress Notes (Signed)
Chart reviewed - agree with RN documentation.   

## 2019-09-20 ENCOUNTER — Other Ambulatory Visit: Payer: Self-pay | Admitting: Family Medicine

## 2019-09-23 ENCOUNTER — Ambulatory Visit: Payer: Medicaid Other | Admitting: Family Medicine

## 2019-09-27 ENCOUNTER — Ambulatory Visit: Payer: Medicaid Other

## 2019-10-05 ENCOUNTER — Ambulatory Visit (INDEPENDENT_AMBULATORY_CARE_PROVIDER_SITE_OTHER): Payer: Medicaid Other

## 2019-10-05 ENCOUNTER — Other Ambulatory Visit: Payer: Self-pay

## 2019-10-05 VITALS — BP 147/105 | HR 79 | Wt 153.1 lb

## 2019-10-05 DIAGNOSIS — Z3042 Encounter for surveillance of injectable contraceptive: Secondary | ICD-10-CM | POA: Diagnosis not present

## 2019-10-05 MED ORDER — MEDROXYPROGESTERONE ACETATE 150 MG/ML IM SUSP
150.0000 mg | Freq: Once | INTRAMUSCULAR | Status: AC
  Administered 2019-10-05: 150 mg via INTRAMUSCULAR

## 2019-10-05 NOTE — Progress Notes (Addendum)
Kelli Scott here for Depo-Provera  Injection.  Injection administered without complication. Patient will return in 3 months for next injection.  [name], CMA 10/05/2019  10:02 AM   Patient seen and assessed by nursing staff during this encounter. I have reviewed the chart and agree with the documentation and plan.  Wynelle Bourgeois, CNM 10/05/2019 11:48 AM

## 2019-10-21 ENCOUNTER — Other Ambulatory Visit (HOSPITAL_COMMUNITY)
Admission: RE | Admit: 2019-10-21 | Discharge: 2019-10-21 | Disposition: A | Discharge status: Home / Self Care | Payer: Medicaid Other | Source: Ambulatory Visit | Attending: Family Medicine | Admitting: Family Medicine

## 2019-10-21 ENCOUNTER — Other Ambulatory Visit: Payer: Self-pay

## 2019-10-21 ENCOUNTER — Ambulatory Visit (INDEPENDENT_AMBULATORY_CARE_PROVIDER_SITE_OTHER): Payer: Medicaid Other | Admitting: Family Medicine

## 2019-10-21 ENCOUNTER — Encounter: Payer: Self-pay | Admitting: Family Medicine

## 2019-10-21 VITALS — BP 164/110 | HR 79 | Ht 59.0 in | Wt 157.0 lb

## 2019-10-21 DIAGNOSIS — O10919 Unspecified pre-existing hypertension complicating pregnancy, unspecified trimester: Secondary | ICD-10-CM

## 2019-10-21 DIAGNOSIS — Z113 Encounter for screening for infections with a predominantly sexual mode of transmission: Secondary | ICD-10-CM | POA: Diagnosis not present

## 2019-10-21 DIAGNOSIS — Z01419 Encounter for gynecological examination (general) (routine) without abnormal findings: Secondary | ICD-10-CM | POA: Diagnosis not present

## 2019-10-21 DIAGNOSIS — I10 Essential (primary) hypertension: Secondary | ICD-10-CM

## 2019-10-21 MED ORDER — HYDROCHLOROTHIAZIDE 25 MG PO TABS: 25.0000 mg | ORAL_TABLET | Freq: Every day | ORAL | 1 refills | Status: DC

## 2019-10-21 NOTE — Progress Notes (Signed)
Patient states she does have a PCP that said she did not need any blood pressure medication at this time. Patient has had consisently elevated BPs when in our office.   Patient is 7 months postpartum and here for annual exam. Patient does admit that she is upset today over an argument with her boyfriend today. Kelli Stammer RN

## 2019-10-21 NOTE — Progress Notes (Signed)
GYNECOLOGY ANNUAL PREVENTATIVE CARE ENCOUNTER NOTE  Subjective:   Kelli Scott is a 24 y.o. G61P0101 female here for a routine annual gynecologic exam.  Current complaints: none.   Denies abnormal vaginal bleeding, discharge, pelvic pain, problems with intercourse or other gynecologic concerns.    Gynecologic History No LMP recorded. Patient has had an injection. Patient is sexually active  Contraception: Depo-Provera injections Last Pap: 2015. Results were: normal Last mammogram: n/a. Results were: normal  Obstetric History OB History  Gravida Para Term Preterm AB Living  1 1   1   1   SAB TAB Ectopic Multiple Live Births        0 1    # Outcome Date GA Lbr Len/2nd Weight Sex Delivery Anes PTL Lv  1 Preterm 03/18/19 [redacted]w[redacted]d  4 lb 11.1 oz (2.13 kg) M CS-LTranv EPI, Local  LIV    Past Medical History:  Diagnosis Date  . Chronic kidney disease   . Hypertension     Past Surgical History:  Procedure Laterality Date  . CESAREAN SECTION N/A 03/18/2019   Procedure: CESAREAN SECTION;  Surgeon: Aletha Halim, MD;  Location: [facility] LD ORS;  Service: Obstetrics;  Laterality: N/A;  . NEPHRECTOMY Left   . WISDOM TOOTH EXTRACTION Bilateral     Current Outpatient Medications on File Prior to Visit  Medication Sig Dispense Refill  . aspirin EC 81 MG tablet Take 81 mg by mouth daily.    . ferrous sulfate 325 (65 FE) MG tablet Take 1 tablet (325 mg total) by mouth 2 (two) times daily with a meal. (Patient not taking: Reported on 07/12/2019) 60 tablet 2  . gabapentin (NEURONTIN) 100 MG capsule Take 1 capsule (100 mg total) by mouth 2 (two) times daily. (Patient not taking: Reported on 07/12/2019) 30 capsule 0  . medroxyPROGESTERone (DEPO-PROVERA) 150 MG/ML injection INJECT 1 ML (150 MG TOTAL) INTO THE MUSCLE EVERY 3 (THREE) MONTHS. (Patient not taking: Reported on 10/21/2019) 1 mL 0  . Norethindrone Acetate-Ethinyl Estrad-FE (LOESTRIN 24 FE) 1-20 MG-MCG(24) tablet Take 1 tablet by mouth daily.  (Patient not taking: Reported on 07/12/2019) 1 Package 3  . Prenatal MV-Min-FA-Omega-3 (PRENATAL GUMMIES/DHA & FA) 0.4-32.5 MG CHEW Chew by mouth.    . senna-docusate (SENOKOT-S) 8.6-50 MG tablet Take 2 tablets by mouth 2 (two) times daily as needed for mild constipation or moderate constipation. (Patient not taking: Reported on 07/12/2019) 30 tablet 2   No current facility-administered medications on file prior to visit.    Allergies  Allergen Reactions  . Ibuprofen     Social History   Socioeconomic History  . Marital status: Single    Spouse name: Not on file  . Number of children: Not on file  . Years of education: Not on file  . Highest education level: Not on file  Occupational History  . Not on file  Tobacco Use  . Smoking status: Current Some Day Smoker    Types: Cigarettes  . Smokeless tobacco: Never Used  Substance and Sexual Activity  . Alcohol use: Not Currently  . Drug use: Not Currently  . Sexual activity: Yes    Birth control/protection: None  Other Topics Concern  . Not on file  Social History Narrative  . Not on file   Social Determinants of Health   Financial Resource Strain:   . Difficulty of Paying Living Expenses: Not on file  Food Insecurity:   . Worried About Charity fundraiser in the Last Year: Not on file  .  Ran Out of Food in the Last Year: Not on file  Transportation Needs:   . Lack of Transportation (Medical): Not on file  . Lack of Transportation (Non-Medical): Not on file  Physical Activity:   . Days of Exercise per Week: Not on file  . Minutes of Exercise per Session: Not on file  Stress:   . Feeling of Stress : Not on file  Social Connections:   . Frequency of Communication with Friends and Family: Not on file  . Frequency of Social Gatherings with Friends and Family: Not on file  . Attends Religious Services: Not on file  . Active Member of Clubs or Organizations: Not on file  . Attends Banker Meetings: Not on  file  . Marital Status: Not on file  Intimate Partner Violence:   . Fear of Current or Ex-Partner: Not on file  . Emotionally Abused: Not on file  . Physically Abused: Not on file  . Sexually Abused: Not on file    Family History  Problem Relation Age of Onset  . Hypertension Father   . Hypertension Brother   . Stroke Brother     The following portions of the patient's history were reviewed and updated as appropriate: allergies, current medications, past family history, past medical history, past social history, past surgical history and problem list.  Review of Systems Pertinent items are noted in HPI.   Objective:  BP (!) 164/110   Pulse 79   Ht 4\' 11"  (1.499 m)   Wt 157 lb (71.2 kg)   BMI 31.71 kg/m  Wt Readings from Last 3 Encounters:  10/21/19 157 lb (71.2 kg)  10/05/19 153 lb 1.3 oz (69.4 kg)  07/12/19 145 lb (65.8 kg)     Chaperone present during exam  CONSTITUTIONAL: Well-developed, well-nourished female in no acute distress.  HENT:  Normocephalic, atraumatic, External right and left ear normal. Oropharynx is clear and moist EYES: Conjunctivae and EOM are normal. Pupils are equal, round, and reactive to light. No scleral icterus.  NECK: Normal range of motion, supple, no masses.  Normal thyroid.   CARDIOVASCULAR: Normal heart rate noted, regular rhythm RESPIRATORY: Clear to auscultation bilaterally. Effort and breath sounds normal, no problems with respiration noted. BREASTS: Symmetric in size. No masses, skin changes, nipple drainage, or lymphadenopathy. ABDOMEN: Soft, normal bowel sounds, no distention noted.  No tenderness, rebound or guarding.  PELVIC: Normal appearing external genitalia; normal appearing vaginal mucosa and cervix.  No abnormal discharge noted.  Normal uterine size, no other palpable masses, no uterine or adnexal tenderness. MUSCULOSKELETAL: Normal range of motion. No tenderness.  No cyanosis, clubbing, or edema.  2+ distal pulses. SKIN:  Skin is warm and dry. No rash noted. Not diaphoretic. No erythema. No pallor. NEUROLOGIC: Alert and oriented to person, place, and time. Normal reflexes, muscle tone coordination. No cranial nerve deficit noted. PSYCHIATRIC: Normal mood and affect. Normal behavior. Normal judgment and thought content.  Assessment:  Annual gynecologic examination with pap smear   Plan:  1. Well Woman Exam Will follow up results of pap smear and manage accordingly. STD testing discussed. Patient requested testing - Cytology - PAP( [facility])  2. Screening for STD (sexually transmitted disease) - Hepatitis B Surface AntiGEN - Hepatitis C Antibody - HIV antibody (with reflex) - RPR  3. HTN Stage 2 HTN. BP consistently elevated with depo shots. Recommended starting on BP medicine. Start HCTZ 25mg  daily. She should have a follow up appt with PCP in the next  month or so.  Routine preventative health maintenance measures emphasized. Please refer to After Visit Summary for other counseling recommendations.    Candelaria Celeste, DO [facility]

## 2019-10-22 LAB — HIV ANTIBODY (ROUTINE TESTING W REFLEX): HIV Screen 4th Generation wRfx: NONREACTIVE

## 2019-10-22 LAB — HEPATITIS C ANTIBODY: Hep C Virus Ab: <0.1 s/co ratio (ref 0.0–0.9)

## 2019-10-22 LAB — RPR: RPR Ser Ql: NONREACTIVE

## 2019-10-22 LAB — HEPATITIS B SURFACE ANTIGEN: Hepatitis B Surface Ag: NEGATIVE

## 2019-10-25 LAB — CYTOLOGY - PAP
Chlamydia: NEGATIVE
Comment: NEGATIVE
Comment: NORMAL
Diagnosis: NEGATIVE
Neisseria Gonorrhea: NEGATIVE

## 2020-01-04 ENCOUNTER — Ambulatory Visit (INDEPENDENT_AMBULATORY_CARE_PROVIDER_SITE_OTHER): Payer: Medicaid Other

## 2020-01-04 ENCOUNTER — Other Ambulatory Visit: Payer: Self-pay

## 2020-01-04 VITALS — BP 141/96 | HR 77 | Ht 59.0 in | Wt 162.0 lb

## 2020-01-04 DIAGNOSIS — Z3042 Encounter for surveillance of injectable contraceptive: Secondary | ICD-10-CM

## 2020-01-04 MED ORDER — MEDROXYPROGESTERONE ACETATE 150 MG/ML IM SUSP
150.0000 mg | Freq: Once | INTRAMUSCULAR | Status: AC
  Administered 2020-01-04: 11:00:00 150 mg via INTRAMUSCULAR

## 2020-01-04 MED ORDER — MEDROXYPROGESTERONE ACETATE 150 MG/ML IM SUSP: 150.0000 mg | INTRAMUSCULAR | 0 refills | Status: DC

## 2020-01-04 NOTE — Progress Notes (Addendum)
Kelli Scott here for Depo-Provera  Injection.  Injection administered without complication. Patient will return in 3 months for next injection.  Rodneisha Bonnet l Alois Mincer, CMA 01/04/2020  11:10 AM   Patient was assessed and managed by nursing staff during this encounter. I have reviewed the chart and agree with the documentation and plan. I have also made any necessary editorial changes.  Wynelle Bourgeois, CNM 01/05/2020 2:42 AM

## 2020-01-06 IMAGING — US US OB < 14 WEEKS - US OB TV
2 series · 15 of 28 positions shown · non-contrast
Comparison: None.

CLINICAL DATA: 22-year-old female with abdominal pain and nausea in
the 1st trimester of pregnancy. Quantitative beta HCG [DATE].
Estimated gestational age by LMP 4 weeks 3 days.

EXAM:
OBSTETRIC <14 WK US AND TRANSVAGINAL OB US
TECHNIQUE: Both transabdominal and transvaginal ultrasound examinations were
performed for complete evaluation of the gestation as well as the
maternal uterus, adnexal regions, and pelvic cul-de-sac.
Transvaginal technique was performed to assess early pregnancy.

[Series 1: us ob < 14 weeks - us ob tv · 14 of 37 slices shown (1 of 2)]
[im 1/37]
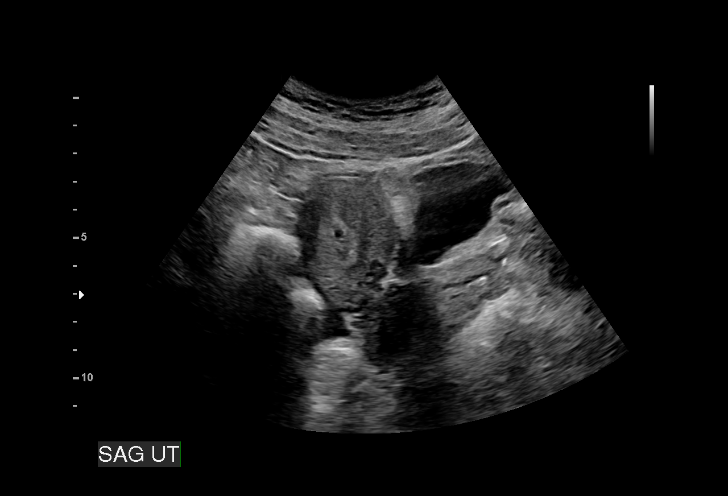
[im 3/37]
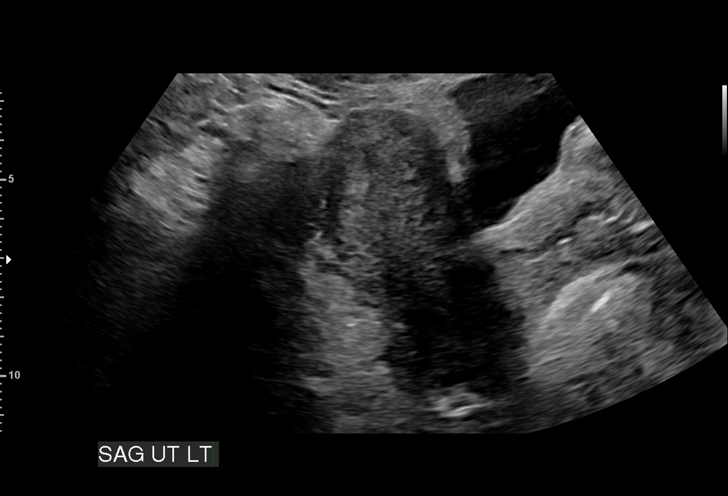
[im 6/37]
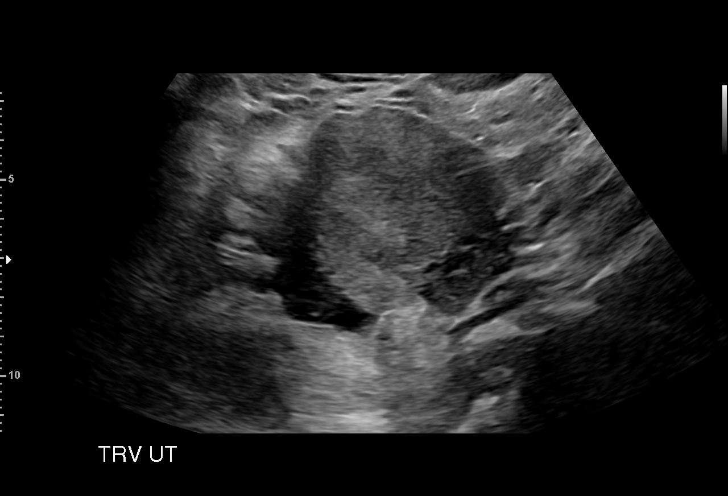
[im 9/37]
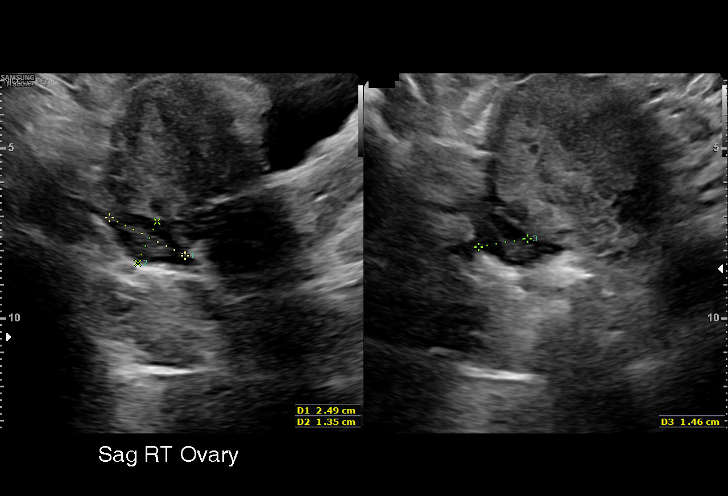
[im 12/37]
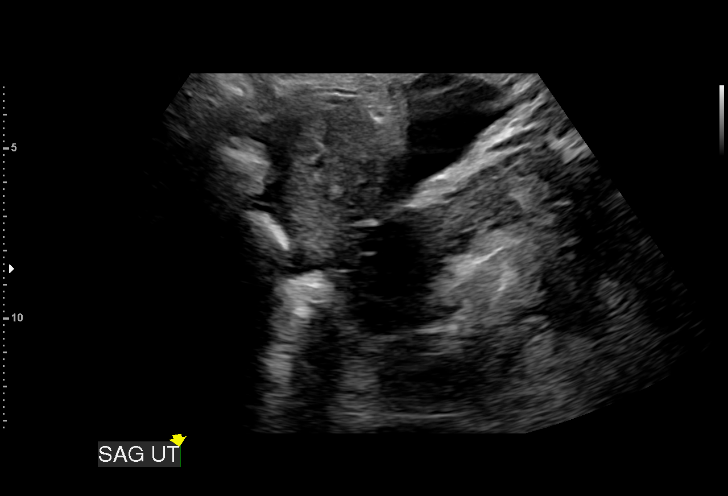
[im 14/37]
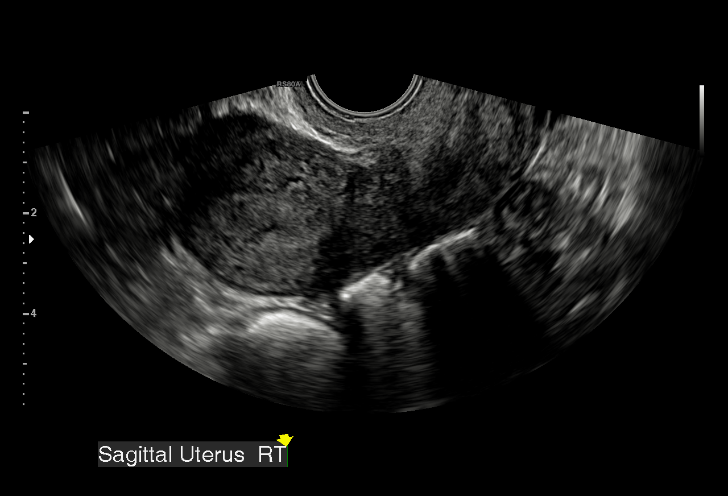
[im 17/37]
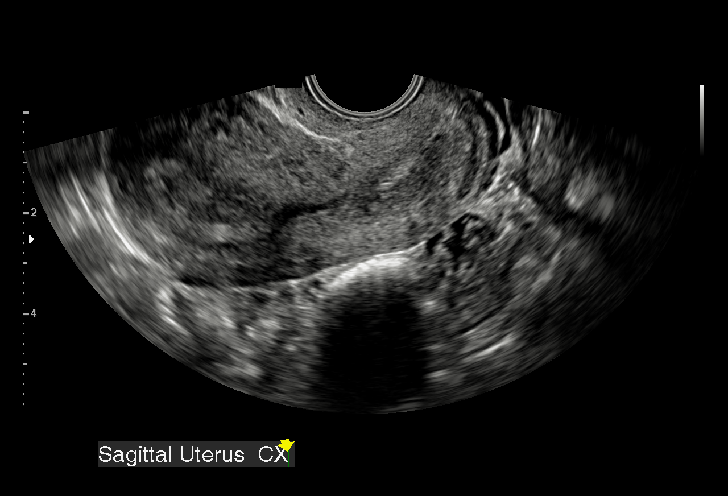
[im 20/37]
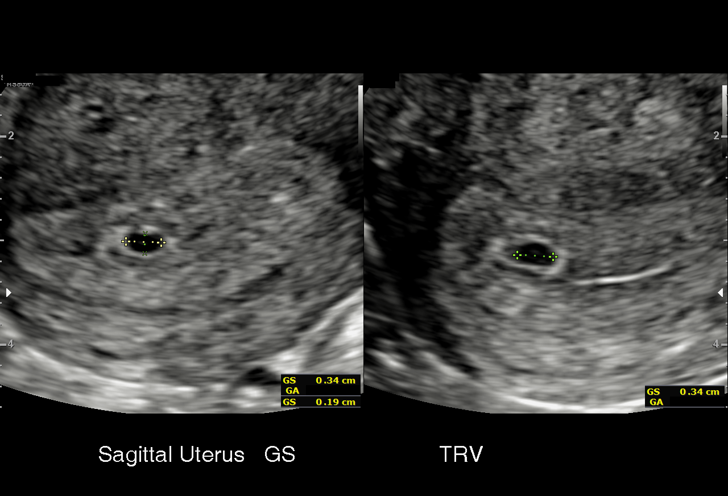
[im 21/37]
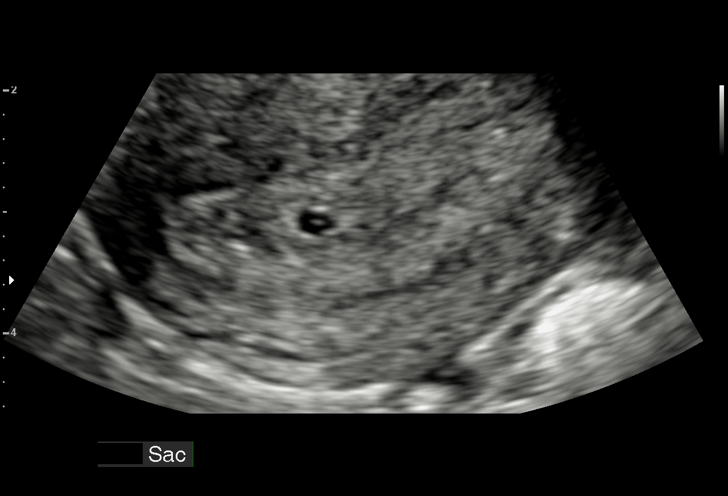
[im 24/37]
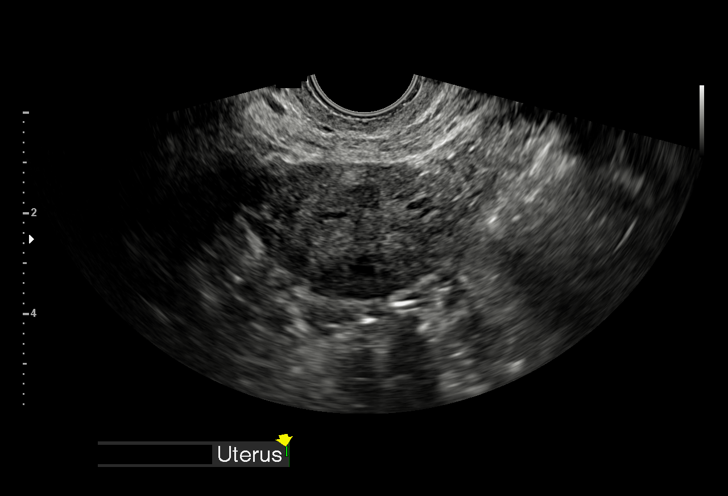
[im 27/37]
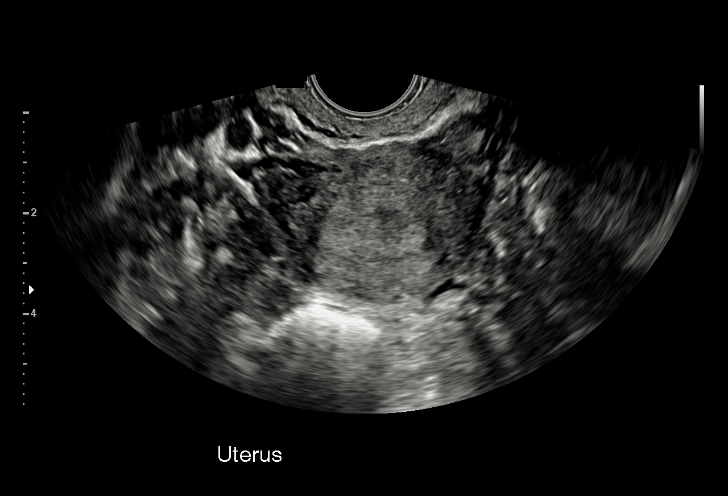
[im 30/37]
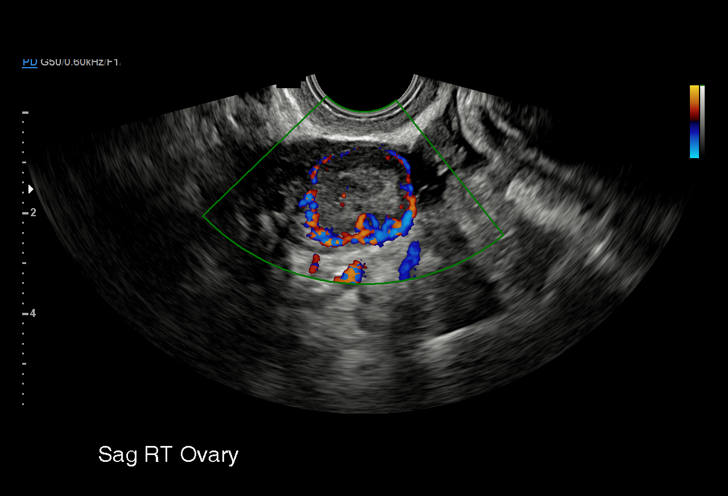
[im 32/37]
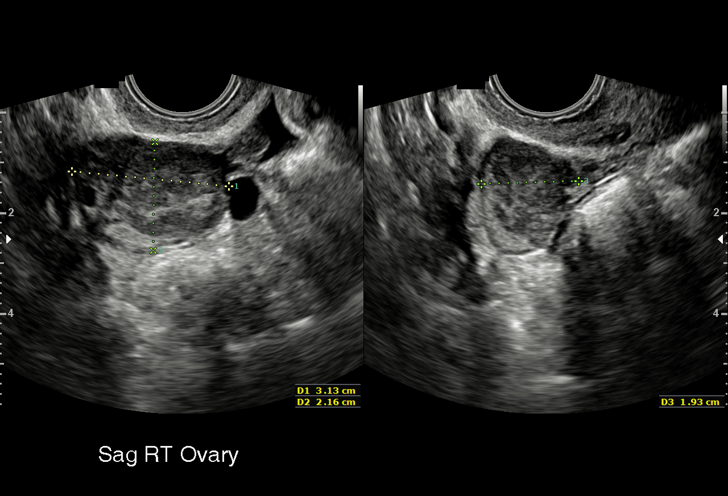
[im 35/37]
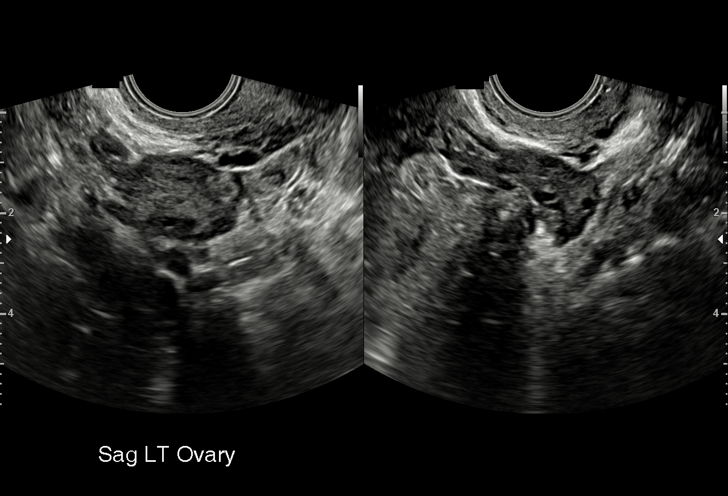

[Series 3: us ob < 14 weeks - us ob tv · 1 of 1 slices shown (2 of 2)]
[im 1/1]
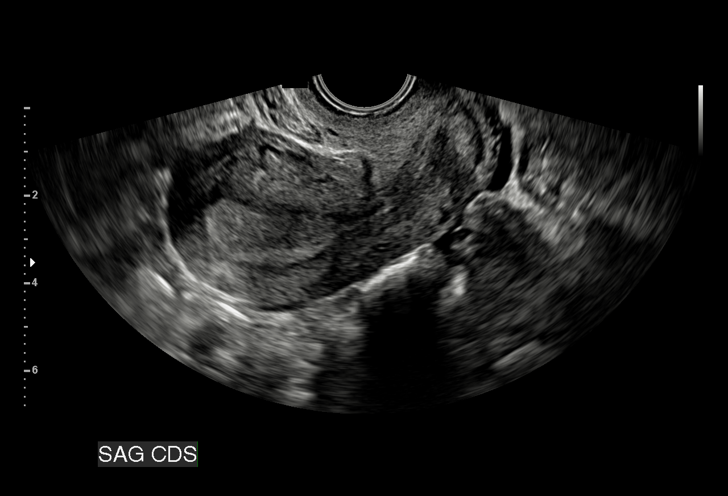

[15 of 28 positions shown; findings below may reference images not displayed]

FINDINGS: Intrauterine gestational sac: Single (image 22)

Yolk sac:  Probable yolk sac (image 23)

Embryo:  Not visible

Cardiac Activity: Not evident

MSD: 2.9 mm   4 w   6 d

Subchorionic hemorrhage:  None visualized.

Maternal uterus/adnexae: Probable right ovary corpus luteum (image
32). The right ovary is 3.1 x 2.2 x 1.9 centimeters. The left ovary
is 2.5 x 1.7 x 1.5 centimeters and appears normal. Trace simple
appearing pelvic free fluid (image 34).
IMPRESSION: 1. Probable early intrauterine gestational sac with faint yolk sac,
but no fetal pole, or cardiac activity yet visualized.
Recommend follow-up quantitative B-HCG levels and follow-up US in 14
days to confirm and assess viability. This recommendation follows
SRU consensus guidelines: Diagnostic Criteria for Nonviable
Pregnancy Early in the First Trimester. N Engl J Med 7333;
2. No subchorionic hemorrhage. Probable right ovary corpus luteum.
Trace simple appearing pelvic free fluid.

## 2020-02-09 ENCOUNTER — Encounter (HOSPITAL_COMMUNITY): Payer: Self-pay | Admitting: *Deleted

## 2020-02-09 ENCOUNTER — Emergency Department (HOSPITAL_COMMUNITY): Payer: Medicaid Other

## 2020-02-09 ENCOUNTER — Other Ambulatory Visit: Payer: Self-pay

## 2020-02-09 ENCOUNTER — Emergency Department (HOSPITAL_COMMUNITY)
Admission: EM | Admit: 2020-02-09 | Discharge: 2020-02-10 | Disposition: A | Discharge status: Home / Self Care | Payer: Medicaid Other | Attending: Emergency Medicine | Admitting: Emergency Medicine

## 2020-02-09 DIAGNOSIS — I10 Essential (primary) hypertension: Secondary | ICD-10-CM | POA: Diagnosis not present

## 2020-02-09 DIAGNOSIS — Z5321 Procedure and treatment not carried out due to patient leaving prior to being seen by health care provider: Secondary | ICD-10-CM | POA: Diagnosis not present

## 2020-02-09 DIAGNOSIS — R0789 Other chest pain: Secondary | ICD-10-CM | POA: Insufficient documentation

## 2020-02-09 LAB — BASIC METABOLIC PANEL
Anion gap: 10 (ref 5–15)
BUN: 12 mg/dL (ref 6–20)
CO2: 24 mmol/L (ref 22–32)
Calcium: 9.6 mg/dL (ref 8.9–10.3)
Chloride: 102 mmol/L (ref 98–111)
Creatinine, Ser: 0.74 mg/dL (ref 0.44–1.00)
GFR calc Af Amer: >60 mL/min (ref >60)
GFR calc non Af Amer: >60 mL/min (ref >60)
Glucose, Bld: 98 mg/dL (ref 70–99)
Potassium: 3.7 mmol/L (ref 3.5–5.1)
Sodium: 136 mmol/L (ref 135–145)

## 2020-02-09 LAB — I-STAT BETA HCG BLOOD, ED (MC, WL, AP ONLY): I-stat hCG, quantitative: <5 m[IU]/mL (ref <5)

## 2020-02-09 LAB — CBC
HCT: 41.2 % (ref 36.0–46.0)
Hemoglobin: 13.3 g/dL (ref 12.0–15.0)
MCH: 27.8 pg (ref 26.0–34.0)
MCHC: 32.3 g/dL (ref 30.0–36.0)
MCV: 86 fL (ref 80.0–100.0)
Platelets: 188 10*3/uL (ref 150–400)
RBC: 4.79 MIL/uL (ref 3.87–5.11)
RDW: 14.6 % (ref 11.5–15.5)
WBC: 8.4 10*3/uL (ref 4.0–10.5)
nRBC: 0 % (ref 0.0–0.2)

## 2020-02-09 LAB — TROPONIN I (HIGH SENSITIVITY): Troponin I (High Sensitivity): <2 ng/L (ref <18)

## 2020-02-09 MED ORDER — SODIUM CHLORIDE 0.9% FLUSH: 3.0000 mL | Freq: Once | INTRAVENOUS | Status: DC

## 2020-02-09 NOTE — ED Notes (Signed)
Pt states she is leaving  

## 2020-02-09 NOTE — ED Triage Notes (Signed)
The pt is c/o her bp being high today  She is also c/o chest pain  She has not had her bp med for one month  lmp depo-  injection

## 2020-03-13 ENCOUNTER — Other Ambulatory Visit (HOSPITAL_COMMUNITY): Payer: Self-pay | Admitting: Nephrology

## 2020-03-13 DIAGNOSIS — I129 Hypertensive chronic kidney disease with stage 1 through stage 4 chronic kidney disease, or unspecified chronic kidney disease: Secondary | ICD-10-CM

## 2020-03-13 DIAGNOSIS — Z905 Acquired absence of kidney: Secondary | ICD-10-CM

## 2020-03-15 ENCOUNTER — Ambulatory Visit (HOSPITAL_COMMUNITY)
Admission: RE | Admit: 2020-03-15 | Discharge: 2020-03-15 | Disposition: A | Discharge status: Home / Self Care | Payer: Medicaid Other | Source: Ambulatory Visit | Attending: Nephrology | Admitting: Nephrology

## 2020-03-15 ENCOUNTER — Other Ambulatory Visit: Payer: Self-pay

## 2020-03-15 DIAGNOSIS — Z905 Acquired absence of kidney: Secondary | ICD-10-CM | POA: Diagnosis not present

## 2020-03-15 DIAGNOSIS — I129 Hypertensive chronic kidney disease with stage 1 through stage 4 chronic kidney disease, or unspecified chronic kidney disease: Secondary | ICD-10-CM | POA: Insufficient documentation

## 2020-03-15 NOTE — Progress Notes (Signed)
Renal artery duplex completed. Refer to "CV Proc" under chart review to view preliminary results.  03/15/2020 9:54 AM Eula Fried., MHA, RVT, RDCS, RDMS

## 2020-03-21 ENCOUNTER — Ambulatory Visit: Payer: Medicaid Other

## 2020-03-23 ENCOUNTER — Other Ambulatory Visit: Payer: Self-pay

## 2020-03-23 ENCOUNTER — Ambulatory Visit (INDEPENDENT_AMBULATORY_CARE_PROVIDER_SITE_OTHER): Payer: Medicaid Other

## 2020-03-23 VITALS — BP 125/81 | HR 88

## 2020-03-23 DIAGNOSIS — Z3042 Encounter for surveillance of injectable contraceptive: Secondary | ICD-10-CM

## 2020-03-23 MED ORDER — MEDROXYPROGESTERONE ACETATE 150 MG/ML IM SUSP
150.0000 mg | Freq: Once | INTRAMUSCULAR | Status: AC
  Administered 2020-03-23: 150 mg via INTRAMUSCULAR

## 2020-03-23 NOTE — Progress Notes (Signed)
Chart reviewed - agree with CMA/RN documentation.  ° °

## 2020-06-05 IMAGING — US US MFM OB FOLLOW UP
1 series · 13 of 28 positions shown · non-contrast
Comparison: none

[Series 1: us mfm ob follow up · 13 of 62 slices shown]
[im 3/62]
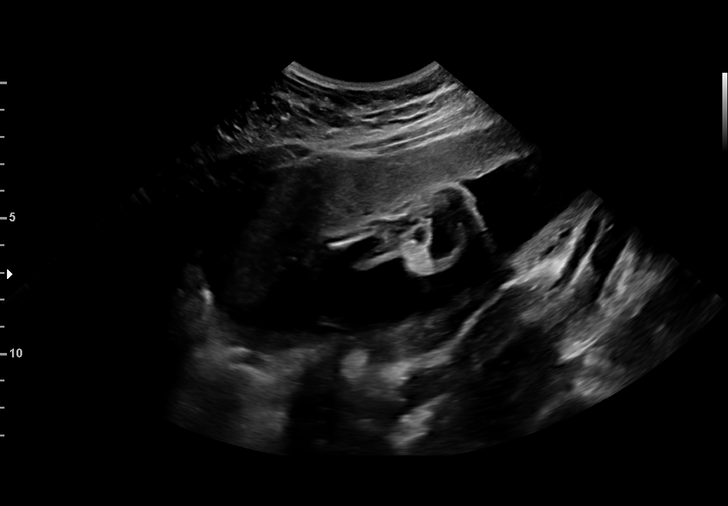
[im 7/62]
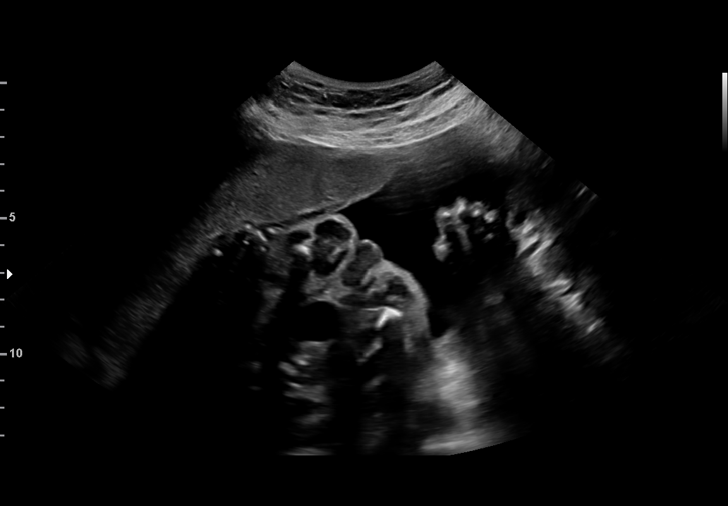
[im 12/62]
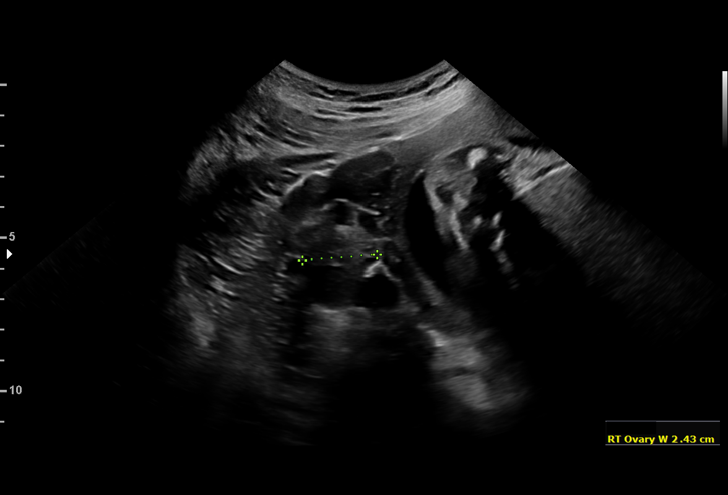
[im 16/62]
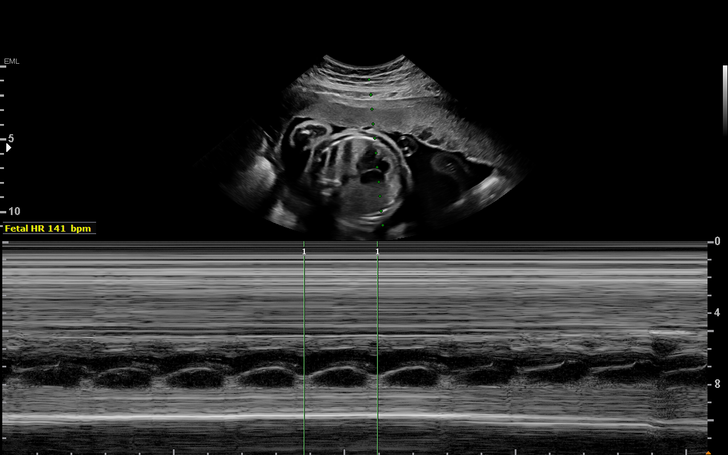
[im 21/62]
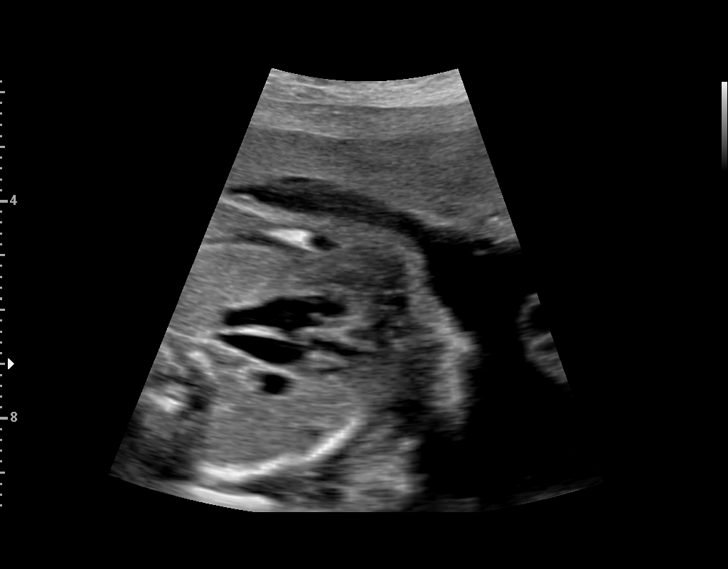
[im 25/62]
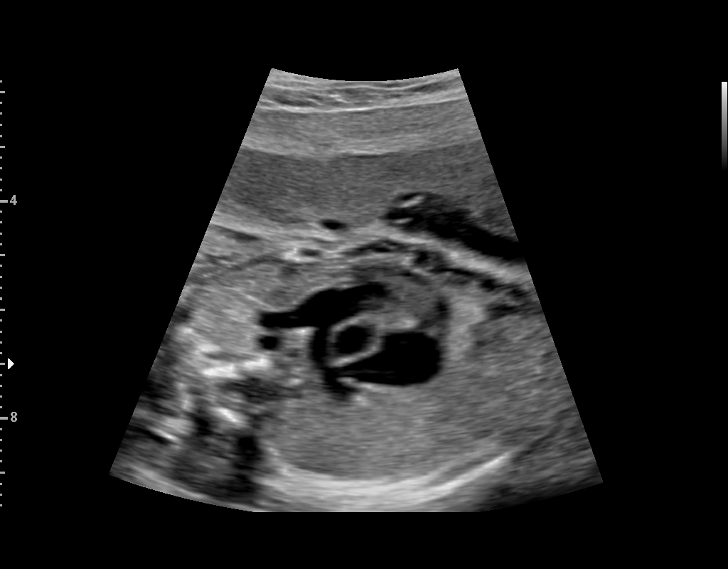
[im 32/62]
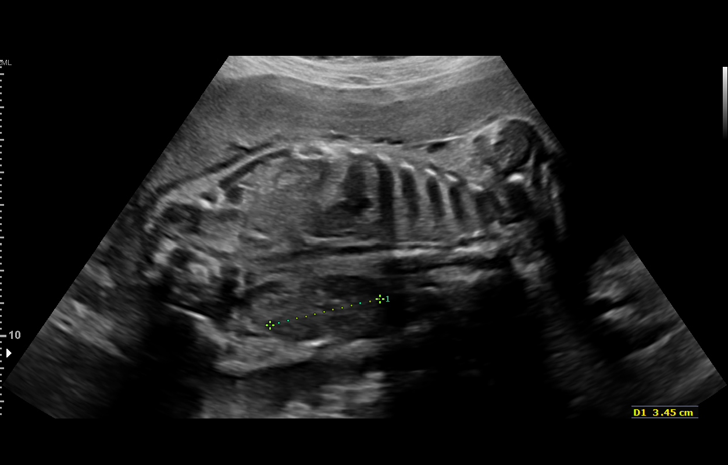
[im 37/62]
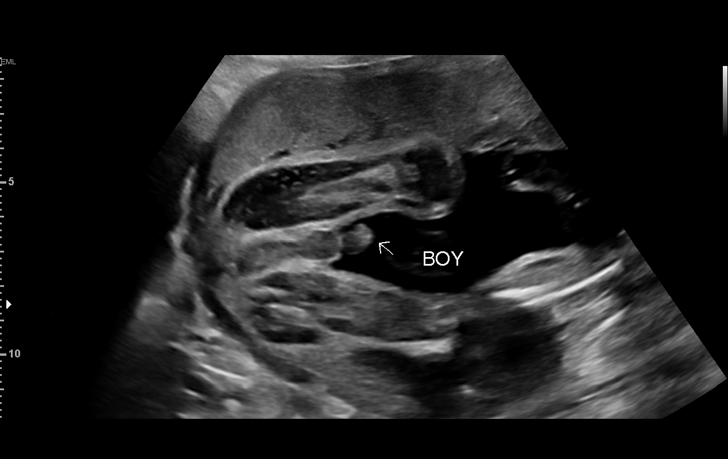
[im 41/62]
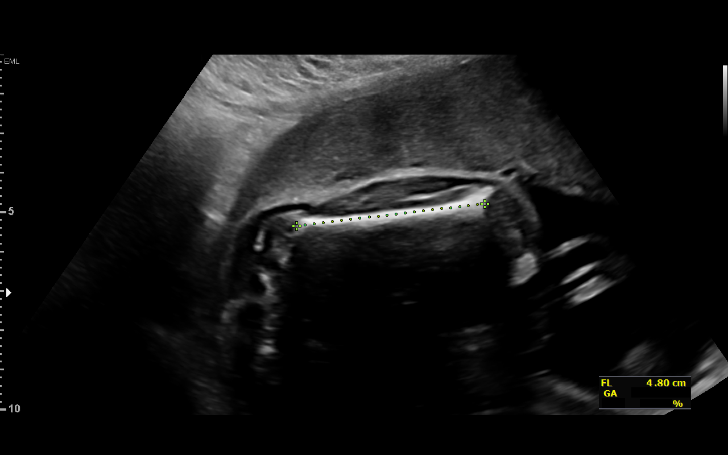
[im 46/62]
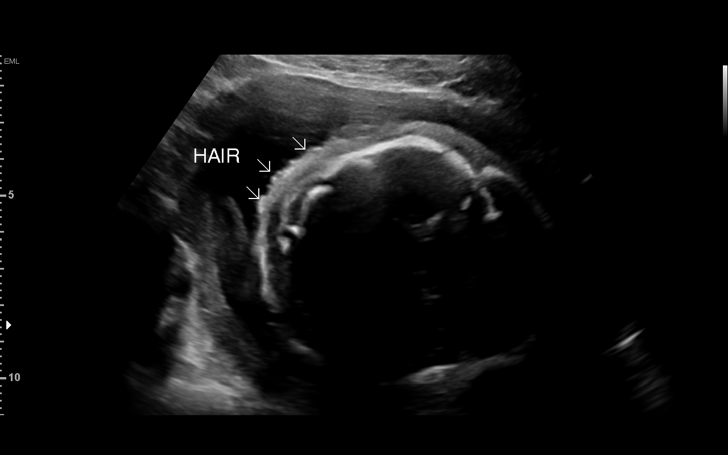
[im 50/62]
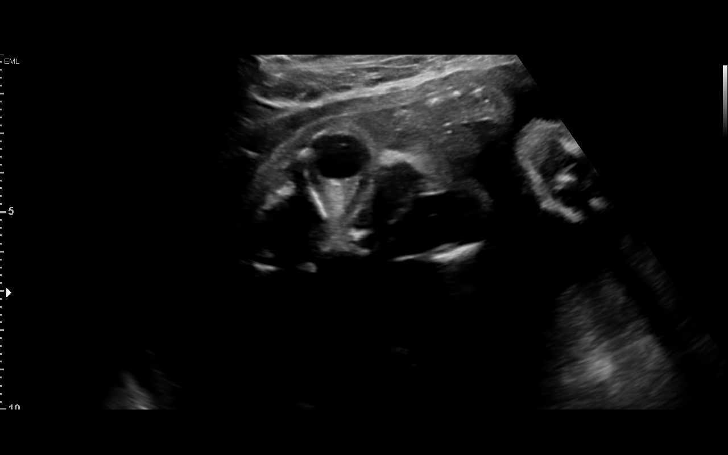
[im 55/62]
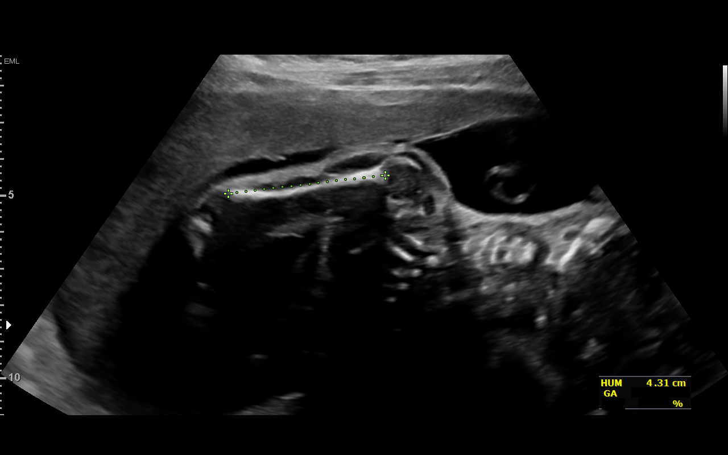
[im 59/62]
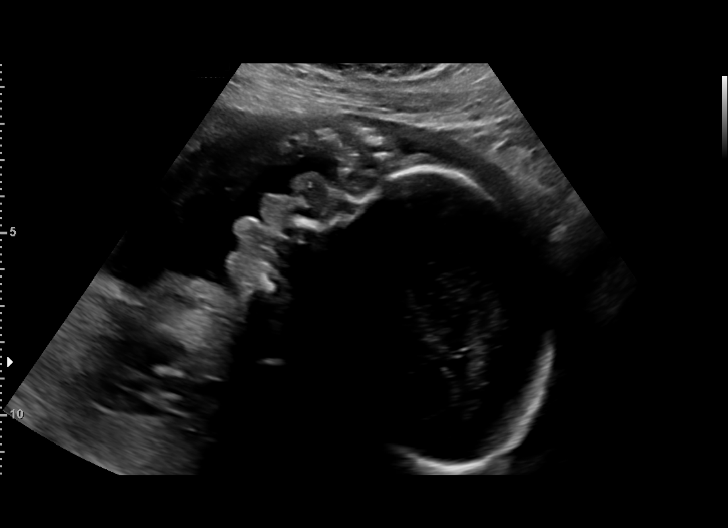

[13 of 28 positions shown; findings below may reference images not displayed]

[place]

 ----------------------------------------------------------------------

 ----------------------------------------------------------------------
Indications

  26 weeks gestation of pregnancy
  Hypertension - Chronic/Pre-existing
  Tobacco use complicating pregnancy,
  second trimester
  Obesity complicating pregnancy, second
  trimester BMI
  Medical complication of pregnancy
  (decreased kidney function, hx nephrectomy)
  Encounter for antenatal screening for
  malformations (Neg Inheritest)

 ----------------------------------------------------------------------
Vital Signs

 BMI:
Fetal Evaluation

 Num Of Fetuses:         1
 Fetal Heart Rate(bpm):  141
 Cardiac Activity:       Observed
 Presentation:           Cephalic
 Placenta:               Anterior
 P. Cord Insertion:      Visualized, central

 Amniotic Fluid
 AFI FV:      Within normal limits

                             Largest Pocket(cm)

Biometry

 BPD:      68.7  mm     G. Age:  27w 4d         89  %    CI:        74.52   %    70 - 86
                                                         FL/HC:      18.8   %    18.6 -
 HC:      252.6  mm     G. Age:  27w 3d         74  %    HC/AC:      1.09        1.04 -
 AC:      232.1  mm     G. Age:  27w 4d         84  %    FL/BPD:     69.1   %    71 - 87
 FL:       47.5  mm     G. Age:  25w 6d         33  %    FL/AC:      20.5   %    20 - 24
 HUM:      42.5  mm     G. Age:  25w 4d         33  %
 LV:        2.6  mm

 Est. FW:    0114  gm      2 lb 4 oz     73  %
OB History

 Gravidity:    1
Gestational Age

 LMP:           26w 0d        Date:  07/17/18                 EDD:   04/23/19
 U/S Today:     27w 1d                                        EDD:   04/15/19
 Best:          26w 0d     Det. By:  LMP  (07/17/18)          EDD:   04/23/19
Anatomy

 Cranium:               Appears normal         Aortic Arch:            Previously seen
 Cavum:                 Appears normal         Ductal Arch:            Previously seen
 Ventricles:            Appears normal         Diaphragm:              Appears normal
 Choroid Plexus:        Appears normal         Stomach:                Appears normal, left
                                                                       sided
 Cerebellum:            Appears normal         Abdomen:                Appears normal
 Posterior Fossa:       Appears normal         Abdominal Wall:         Previously seen
 Nuchal Fold:           Previously seen        Cord Vessels:           Previously seen
 Face:                  Appears normal         Kidneys:                Appear normal
                        (orbits and profile)
 Lips:                  Appears normal         Bladder:                Appears normal
 Thoracic:              Appears normal         Spine:                  Not well visualized
 Heart:                 Echogenic focus        Upper Extremities:      Previously seen
                        in LV
 RVOT:                  Appears normal         Lower Extremities:      Previously seen
 LVOT:                  Appears normal

 Other:  Male gender. Heels and 5th digit previously visualized. Nasal bone
         previously visualized. Technically difficult due to fetal position.
Cervix Uterus Adnexa

 Cervix
 Not visualized (advanced GA >03wks)

 Left Ovary
 Within normal limits.

 Right Ovary
 Within normal limits.

 Adnexa
 No abnormality visualized.
Impression

 Patient returned for completion of fetal anatomy.
 Amniotic fluid is normal and good fetal activity is seen. Fetal
 growth is appropriate for gestational age. Fetal spine could
 not be assessed again because of fetal position. Intracranial
 structures appear normal.
Recommendations

 An appointment was made for her to return in 4 weeks for
 fetal growth assessment (chronic hypertension).
                 Perkims, [place]

## 2020-06-06 ENCOUNTER — Other Ambulatory Visit: Payer: Self-pay

## 2020-06-06 DIAGNOSIS — Z3042 Encounter for surveillance of injectable contraceptive: Secondary | ICD-10-CM

## 2020-06-06 MED ORDER — MEDROXYPROGESTERONE ACETATE 150 MG/ML IM SUSP: 150.0000 mg | INTRAMUSCULAR | 3 refills | Status: DC

## 2020-06-08 ENCOUNTER — Ambulatory Visit (INDEPENDENT_AMBULATORY_CARE_PROVIDER_SITE_OTHER): Payer: Medicaid Other

## 2020-06-08 ENCOUNTER — Other Ambulatory Visit: Payer: Self-pay

## 2020-06-08 VITALS — BP 127/103 | HR 78 | Wt 175.0 lb

## 2020-06-08 DIAGNOSIS — Z3042 Encounter for surveillance of injectable contraceptive: Secondary | ICD-10-CM | POA: Diagnosis not present

## 2020-06-08 MED ORDER — MEDROXYPROGESTERONE ACETATE 150 MG/ML IM SUSP
150.0000 mg | Freq: Once | INTRAMUSCULAR | Status: AC
  Administered 2020-06-08: 150 mg via INTRAMUSCULAR

## 2020-06-08 NOTE — Progress Notes (Signed)
Legrand Rams here for Depo-Provera  Injection.  Injection administered without complication. Patient will return in 3 months for next injection.  Hernando Reali l Arpan Eskelson, CMA 06/08/2020  10:14 AM

## 2020-06-08 NOTE — Progress Notes (Signed)
Chart reviewed - agree with CMA/RN documentation.  ° °

## 2020-07-24 IMAGING — US US MFM FETAL BPP WO NON STRESS
1 series · 15 of 17 positions shown · non-contrast
Comparison: none

[Series 1: us mfm fetal bpp wo non stress · 17 acquisitions, 15 frames shown]
[im 1/17]
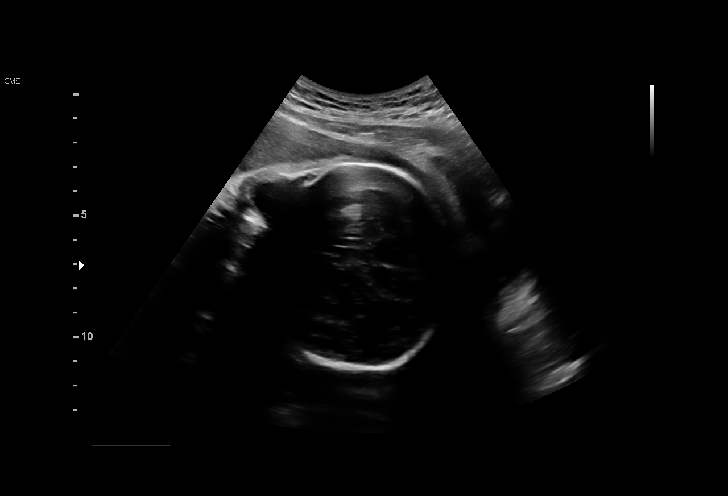
[im 2/17]
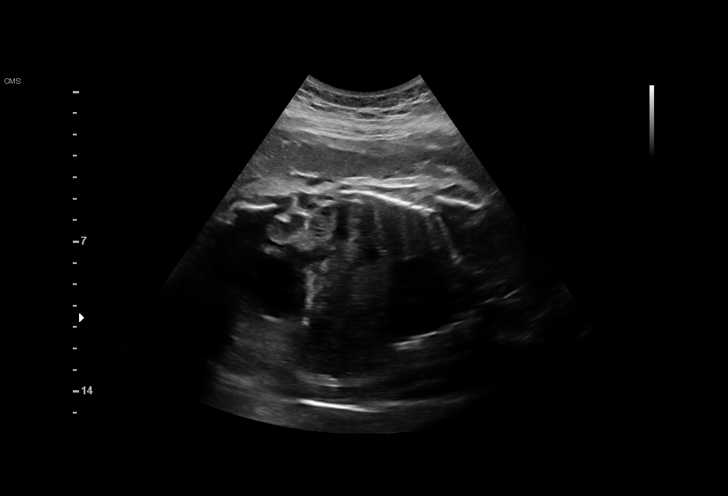
[im 3/17]
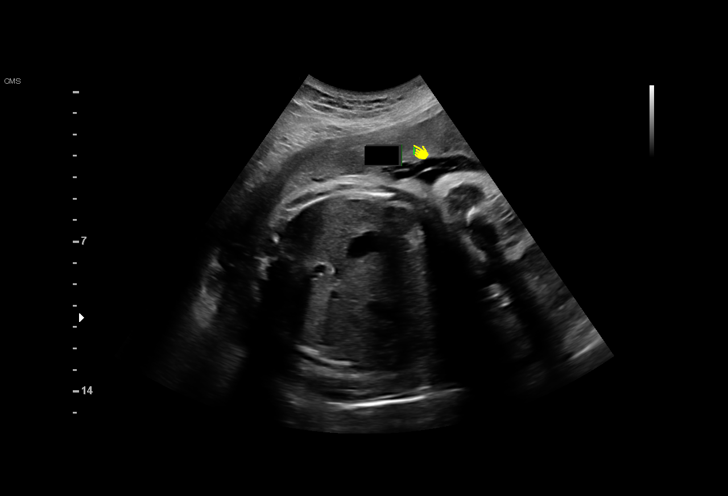
[im 4/17]
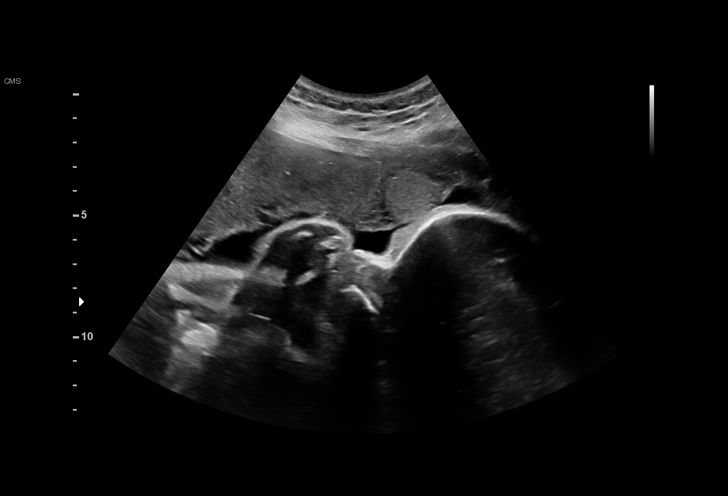
[im 6/17]
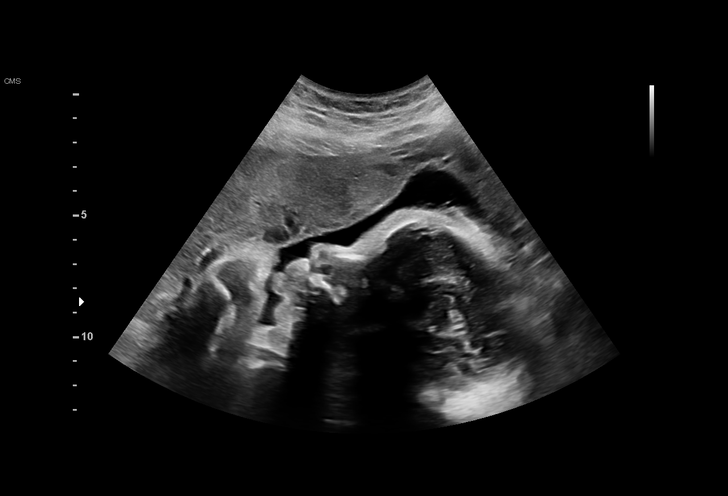
[im 7/17]
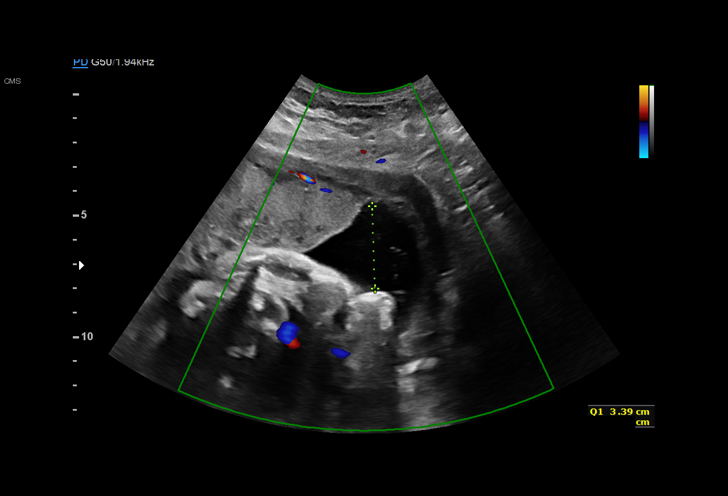
[im 8/17]
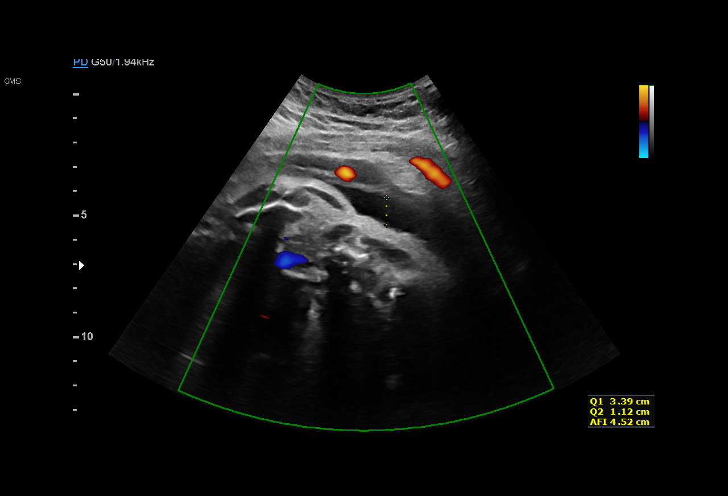
[im 9/17]
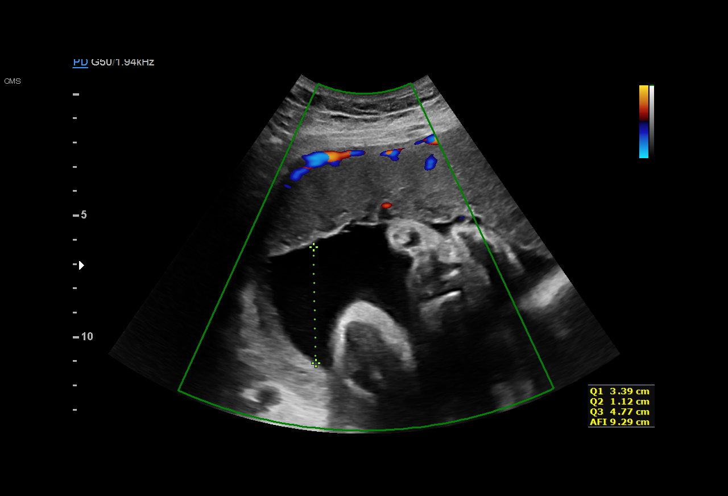
[im 10/17]
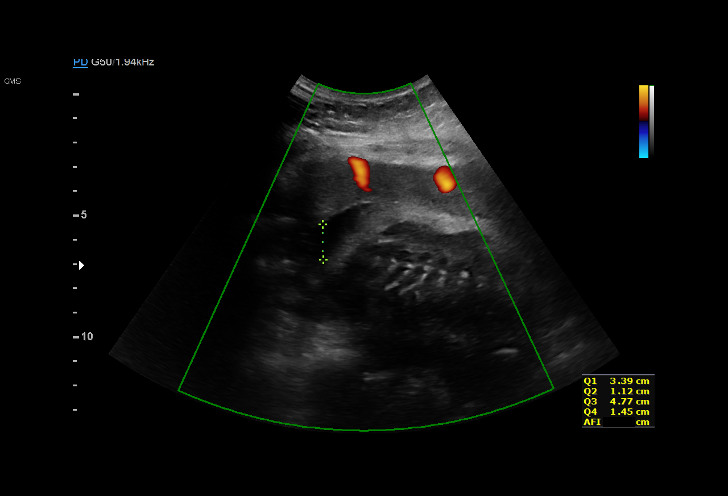
[im 11/17]
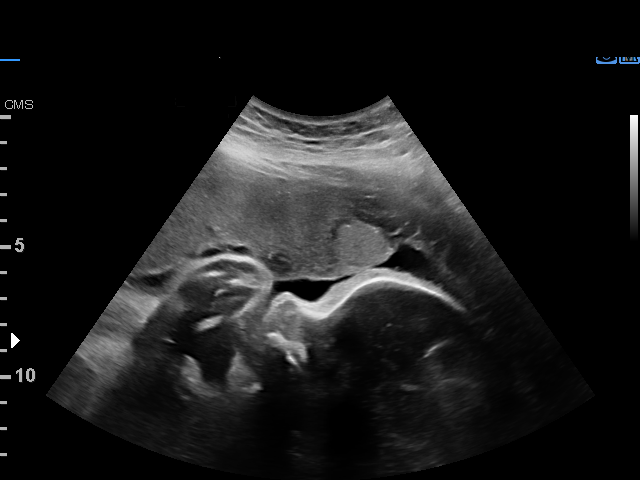
[im 12/17]
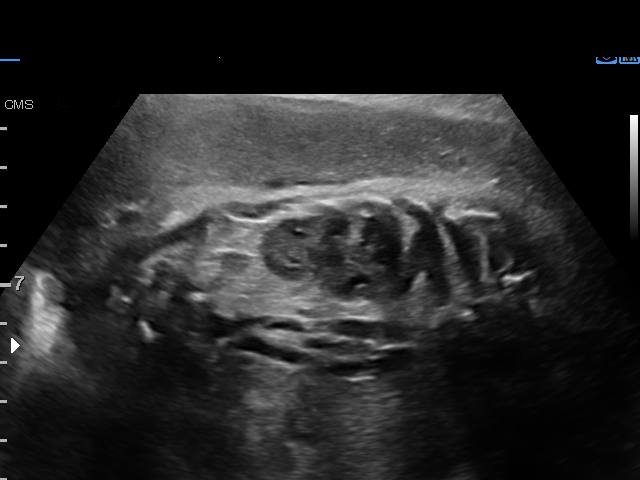
[im 14/17]
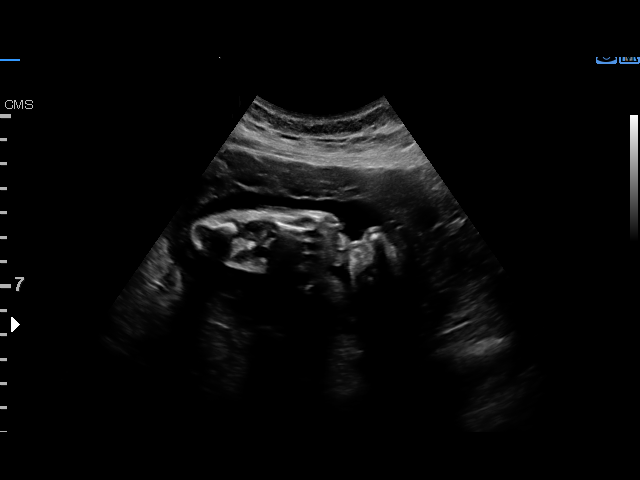
[im 15/17]
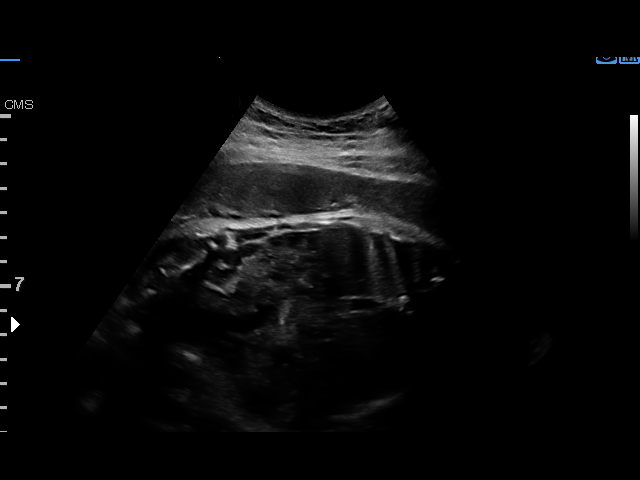
[im 16/17]
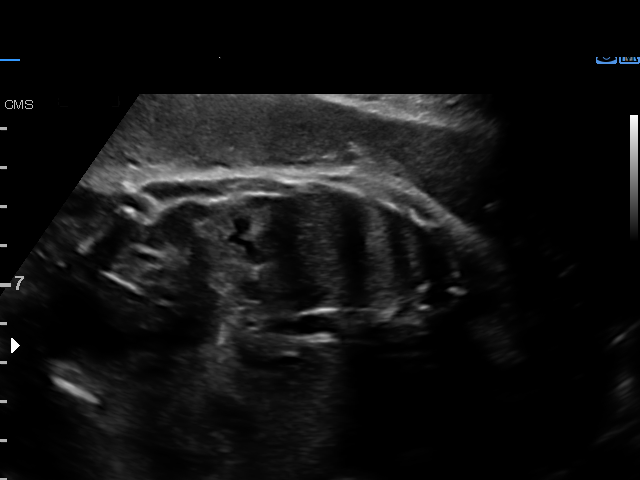
[im 17/17]
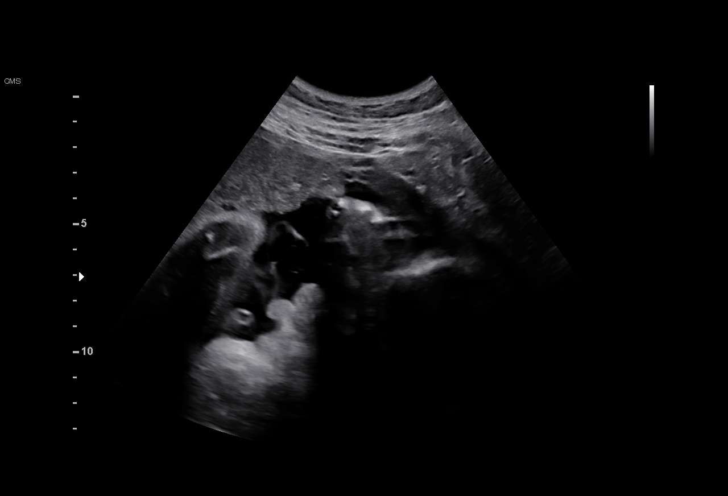

[15 of 17 positions shown; findings below may reference images not displayed]

KLPIGBB

 ----------------------------------------------------------------------

 ----------------------------------------------------------------------
Indications

  Encounter for other antenatal screening
  follow-up (No RAIDH, Neg Inheritest)
  Hypertension - Chronic/Pre-existing
  Tobacco use complicating pregnancy,
  second trimester
  Obesity complicating pregnancy, second
  trimester BMI
  Medical complication of pregnancy
  (decreased kidney function, hx nephrectomy)
  33 weeks gestation of pregnancy
 ----------------------------------------------------------------------
Vital Signs

                                                Height:        5'0"
Fetal Evaluation

 Num Of Fetuses:         1
 Cardiac Activity:       Observed
 Presentation:           Cephalic

 Amniotic Fluid
 AFI FV:      Within normal limits

 AFI Sum(cm)     %Tile       Largest Pocket(cm)
 10.73           23

 RUQ(cm)       RLQ(cm)       LUQ(cm)        LLQ(cm)

Biophysical Evaluation

 Amniotic F.V:   Within normal limits       F. Tone:        Observed
 F. Movement:    Observed                   Score:          [DATE]
 F. Breathing:   Observed
OB History

 Gravidity:    1
Gestational Age

 LMP:           33w 0d        Date:  07/17/18                 EDD:   04/23/19
 Best:          33w 0d     Det. By:  LMP  (07/17/18)          EDD:   04/23/19
Impression

 Amniotic fluid is normal and good fetal activity is seen.
 Antenatal testing is reassuring. BPP [DATE]. Blood pressures at
 our office were 158/108 and 159/107 mm Hg. Yesterday, at
 her prenatal visit, her blood pressures were 156/104 and
 155/111 mm Hg. Labetalol dosage was increased to 400 mg
 twice daily and the patient reports she took the higher dose
 today morning. She does not have symptoms of severe
 features of preeclampsia.
 I discussed evaluation at [facility], but the patient would like to
 wait and see the effect of increased dosage of labetalol.
Recommendations

 -Continue weekly BPP till delivery.
                 Krupp, [place]

## 2020-10-31 ENCOUNTER — Ambulatory Visit (INDEPENDENT_AMBULATORY_CARE_PROVIDER_SITE_OTHER): Payer: Medicaid Other

## 2020-10-31 ENCOUNTER — Other Ambulatory Visit: Payer: Self-pay

## 2020-10-31 VITALS — BP 150/104 | HR 72 | Wt 184.0 lb

## 2020-10-31 DIAGNOSIS — Z3042 Encounter for surveillance of injectable contraceptive: Secondary | ICD-10-CM | POA: Diagnosis not present

## 2020-10-31 MED ORDER — MEDROXYPROGESTERONE ACETATE 150 MG/ML IM SUSP
150.0000 mg | Freq: Once | INTRAMUSCULAR | Status: AC
  Administered 2020-10-31: 150 mg via INTRAMUSCULAR

## 2020-10-31 NOTE — Progress Notes (Addendum)
Legrand Rams here for Depo-Provera  Injection.  Injection administered without complication. Patient will return in 3 months for next injection.  Vearl Aitken l Krishav Mamone, CMA 10/31/2020  9:16 AM    Patient was assessed and managed by nursing staff during this encounter. I have reviewed the chart and agree with the documentation and plan. I have also made any necessary editorial changes.  Wynelle Bourgeois, CNM 10/31/2020 10:10 PM

## 2021-02-27 ENCOUNTER — Other Ambulatory Visit: Payer: Self-pay

## 2021-02-27 ENCOUNTER — Ambulatory Visit: Payer: Medicaid Other

## 2021-02-27 ENCOUNTER — Other Ambulatory Visit (HOSPITAL_COMMUNITY)
Admission: RE | Admit: 2021-02-27 | Discharge: 2021-02-27 | Disposition: A | Discharge status: Home / Self Care | Payer: Medicaid Other | Source: Ambulatory Visit | Attending: Advanced Practice Midwife | Admitting: Advanced Practice Midwife

## 2021-02-27 VITALS — BP 153/106 | HR 95 | Wt 180.0 lb

## 2021-02-27 DIAGNOSIS — N898 Other specified noninflammatory disorders of vagina: Secondary | ICD-10-CM | POA: Insufficient documentation

## 2021-02-27 NOTE — Progress Notes (Addendum)
SUBJECTIVE:  25 y.o. female complains of white vaginal discharge for 1 week(s). Denies abnormal vaginal bleeding or significant pelvic pain or fever. No UTI symptoms. Denies history of known exposure to STD.  No LMP recorded. Patient has had an injection.  OBJECTIVE:  She appears well, afebrile. Urine dipstick: not done.  ASSESSMENT:  Vaginal Discharge     PLAN:  GC, chlamydia, trichomonas, BVAG, CVAG probe sent to lab. Treatment: To be determined once lab results are received ROV prn if symptoms persist or worsen.   Patient was assessed and managed by nursing staff during this encounter. I have reviewed the chart and agree with the documentation and plan. I have also made any necessary editorial changes.  Wynelle Bourgeois, CNM 02/27/2021 8:52 PM

## 2021-03-01 LAB — CERVICOVAGINAL ANCILLARY ONLY
Bacterial Vaginitis (gardnerella): NEGATIVE
Candida Glabrata: NEGATIVE
Candida Vaginitis: POSITIVE — AB
Chlamydia: NEGATIVE
Comment: NEGATIVE
Comment: NEGATIVE
Comment: NEGATIVE
Comment: NEGATIVE
Comment: NEGATIVE
Comment: NORMAL
Neisseria Gonorrhea: NEGATIVE
Trichomonas: NEGATIVE

## 2021-03-02 ENCOUNTER — Other Ambulatory Visit: Payer: Self-pay | Admitting: Advanced Practice Midwife

## 2021-03-02 ENCOUNTER — Ambulatory Visit: Payer: Medicaid Other

## 2021-03-02 MED ORDER — FLUCONAZOLE 150 MG PO TABS
150.0000 mg | ORAL_TABLET | Freq: Once | ORAL | 0 refills | Status: AC
Start: 2021-03-02 — End: 2021-03-02

## 2021-03-02 NOTE — Progress Notes (Signed)
Wet prep showed canida Rx sent for Difluican

## 2021-03-12 ENCOUNTER — Other Ambulatory Visit: Payer: Self-pay

## 2021-03-12 ENCOUNTER — Ambulatory Visit (INDEPENDENT_AMBULATORY_CARE_PROVIDER_SITE_OTHER): Payer: Medicaid Other

## 2021-03-12 VITALS — BP 156/102 | HR 102 | Wt 180.0 lb

## 2021-03-12 DIAGNOSIS — Z3042 Encounter for surveillance of injectable contraceptive: Secondary | ICD-10-CM

## 2021-03-12 LAB — POCT URINE PREGNANCY: Preg Test, Ur: NEGATIVE

## 2021-03-12 MED ORDER — MEDROXYPROGESTERONE ACETATE 150 MG/ML IM SUSP
150.0000 mg | Freq: Once | INTRAMUSCULAR | Status: AC
  Administered 2021-03-12: 150 mg via INTRAMUSCULAR

## 2021-03-12 MED ORDER — MEDROXYPROGESTERONE ACETATE 150 MG/ML IM SUSP: 150.0000 mg | INTRAMUSCULAR | 3 refills | Status: DC

## 2021-03-12 NOTE — Progress Notes (Signed)
Legrand Rams here to restart  Depo-Provera  Injection. Pt states she has not had unprotected sex in the last 2 weeks. UPT; negative. Injection administered without complication. Patient will return in 3 months for next injection.  Kaden Daughdrill l Katharine Rochefort, CMA 03/12/2021  9:04 AM

## 2021-03-12 NOTE — Progress Notes (Signed)
Patient was assessed and managed by nursing staff during this encounter. I have reviewed the chart and agree with the documentation and plan.   Jaynie Collins, MD 03/12/2021 10:15 AM

## 2021-04-20 DIAGNOSIS — J029 Acute pharyngitis, unspecified: Secondary | ICD-10-CM | POA: Diagnosis not present

## 2021-05-28 ENCOUNTER — Ambulatory Visit: Payer: Medicaid Other

## 2021-08-01 ENCOUNTER — Encounter: Payer: Self-pay | Admitting: Obstetrics & Gynecology

## 2021-08-01 ENCOUNTER — Other Ambulatory Visit (HOSPITAL_BASED_OUTPATIENT_CLINIC_OR_DEPARTMENT_OTHER): Payer: Self-pay

## 2021-08-01 ENCOUNTER — Other Ambulatory Visit: Payer: Self-pay

## 2021-08-01 ENCOUNTER — Ambulatory Visit (INDEPENDENT_AMBULATORY_CARE_PROVIDER_SITE_OTHER): Payer: Medicaid Other | Admitting: Obstetrics & Gynecology

## 2021-08-01 VITALS — BP 144/98 | HR 81 | Ht 59.0 in | Wt 183.0 lb

## 2021-08-01 DIAGNOSIS — Z3009 Encounter for other general counseling and advice on contraception: Secondary | ICD-10-CM

## 2021-08-01 DIAGNOSIS — Z01419 Encounter for gynecological examination (general) (routine) without abnormal findings: Secondary | ICD-10-CM

## 2021-08-01 DIAGNOSIS — Z3042 Encounter for surveillance of injectable contraceptive: Secondary | ICD-10-CM | POA: Diagnosis not present

## 2021-08-01 LAB — POCT URINE PREGNANCY: Preg Test, Ur: NEGATIVE

## 2021-08-01 MED ORDER — MEDROXYPROGESTERONE ACETATE 150 MG/ML IM SUSP
150.0000 mg | Freq: Once | INTRAMUSCULAR | Status: AC
  Administered 2021-08-01: 150 mg via INTRAMUSCULAR

## 2021-08-01 MED ORDER — MEDROXYPROGESTERONE ACETATE 150 MG/ML IM SUSY
150.0000 mg | PREFILLED_SYRINGE | INTRAMUSCULAR | 3 refills | Status: DC
  Filled 2021-08-01: qty 1, 90d supply, fill #0
  Filled 2021-10-31: qty 1, 90d supply, fill #1

## 2021-08-01 NOTE — Progress Notes (Signed)
Subjective:     Stephie Xu is a 25 y.o. female here for a routine exam.  Current complaints: Pt here for routine exam. She denies problems.      Gynecologic History Patient's last menstrual period was 07/23/2021 (exact date). Contraception: Depo-Provera injections Last Pap: 09/2019. Results were: normal Last mammogram: n/a.   Obstetric History OB History  Gravida Para Term Preterm AB Living  1 1   1   1   SAB IAB Ectopic Multiple Live Births        0 1    # Outcome Date GA Lbr Len/2nd Weight Sex Delivery Anes PTL Lv  1 Preterm 03/18/19 [redacted]w[redacted]d  4 lb 11.1 oz (2.13 kg) M CS-LTranv EPI, Local  LIV     The following portions of the patient's history were reviewed and updated as appropriate: allergies, current medications, past family history, past medical history, past social history, past surgical history, and problem list.  Review of Systems Pertinent items are noted in HPI.    Objective:  BP (!) 144/98   Pulse 81   Ht 4\' 11"  (1.499 m)   Wt 183 lb (83 kg)   LMP 07/23/2021 (Exact Date)   BMI 36.96 kg/m  General Appearance:    Alert, cooperative, no distress, appears stated age  Head:    Normocephalic, without obvious abnormality, atraumatic  Eyes:    conjunctiva/corneas clear, EOM's intact, both eyes  Ears:    Normal external ear canals, both ears  Nose:   Nares normal, septum midline, mucosa normal, no drainage    or sinus tenderness  Throat:   Lips, mucosa, and tongue normal; teeth and gums normal  Neck:   Supple, symmetrical, trachea midline, no adenopathy;    thyroid:  no enlargement/tenderness/nodules  Back:     Symmetric, no curvature, ROM normal, no CVA tenderness  Lungs:     respirations unlabored  Chest Wall:    No tenderness or deformity   Heart:    Regular rate and rhythm  Breast Exam:    No tenderness, masses, or nipple abnormality  Abdomen:     Soft, non-tender, bowel sounds active all four quadrants,    no masses, no organomegaly  Genitalia:    Normal female  without lesion, discharge or tenderness     Extremities:   Extremities normal, atraumatic, no cyanosis or edema  Pulses:   2+ and symmetric all extremities  Skin:   Skin color, texture, turgor normal, no rashes or lesions     Assessment:    Healthy female exam.    Plan:   Diagnoses and all orders for this visit:  Encounter for counseling regarding contraception -     POCT urine pregnancy -     medroxyPROGESTERone (DEPO-PROVERA) injection 150 mg  Encounter for surveillance of injectable contraceptive -     medroxyPROGESTERone Acetate 150 MG/ML SUSY; Inject 1 mL (150 mg total) into the muscle every 3 (three) months.   F/u in 3 months for Depo F/u in 1 year for annual  Rilley Stash L. Harraway-Lampton, M.D., 

## 2021-08-03 DIAGNOSIS — Z111 Encounter for screening for respiratory tuberculosis: Secondary | ICD-10-CM | POA: Diagnosis not present

## 2021-08-10 ENCOUNTER — Encounter: Payer: Self-pay | Admitting: Obstetrics & Gynecology

## 2021-10-31 ENCOUNTER — Other Ambulatory Visit (HOSPITAL_BASED_OUTPATIENT_CLINIC_OR_DEPARTMENT_OTHER): Payer: Self-pay

## 2021-10-31 ENCOUNTER — Other Ambulatory Visit: Payer: Self-pay

## 2021-10-31 ENCOUNTER — Ambulatory Visit: Payer: Medicaid Other

## 2021-10-31 VITALS — Ht 59.0 in | Wt 184.0 lb

## 2021-10-31 DIAGNOSIS — Z3009 Encounter for other general counseling and advice on contraception: Secondary | ICD-10-CM

## 2021-10-31 NOTE — Progress Notes (Cosign Needed)
Date last pap: 10/21/19 WNL. ?Last Depo-Provera: 08/01/21. ?Side Effects if any: patient complaining of some continued depression. Paitent has a PCP. Patient offered appointment with [facility] and declines. Also made aware of resource at [facility]. ?Serum HCG indicated? N/a. ?Depo-Provera 150 mg IM given by: Burnard Leigh RN . ?Next injection appointment scheduled. Armandina Stammer RN  ?

## 2022-01-17 ENCOUNTER — Ambulatory Visit: Payer: Medicaid Other

## 2022-01-22 ENCOUNTER — Ambulatory Visit (INDEPENDENT_AMBULATORY_CARE_PROVIDER_SITE_OTHER): Payer: Medicaid Other

## 2022-01-22 VITALS — BP 134/92 | HR 75 | Wt 178.0 lb

## 2022-01-22 DIAGNOSIS — Z3042 Encounter for surveillance of injectable contraceptive: Secondary | ICD-10-CM

## 2022-01-22 MED ORDER — MEDROXYPROGESTERONE ACETATE 150 MG/ML IM SUSP
150.0000 mg | Freq: Once | INTRAMUSCULAR | Status: AC
  Administered 2022-01-22: 150 mg via INTRAMUSCULAR

## 2022-01-22 NOTE — Progress Notes (Cosign Needed)
Patient presents for Depo Provera Last Depo 10-31-2021 Patient scheduled for next injection.  Armandina Stammer RN

## 2022-04-17 ENCOUNTER — Ambulatory Visit: Payer: Medicaid Other

## 2022-04-18 ENCOUNTER — Ambulatory Visit (INDEPENDENT_AMBULATORY_CARE_PROVIDER_SITE_OTHER): Payer: Medicaid Other

## 2022-04-18 VITALS — BP 134/81 | HR 82 | Wt 174.0 lb

## 2022-04-18 DIAGNOSIS — Z3042 Encounter for surveillance of injectable contraceptive: Secondary | ICD-10-CM

## 2022-04-18 MED ORDER — MEDROXYPROGESTERONE ACETATE 150 MG/ML IM SUSP: 150.0000 mg | INTRAMUSCULAR | 3 refills | Status: DC

## 2022-04-18 MED ORDER — MEDROXYPROGESTERONE ACETATE 150 MG/ML IM SUSP
150.0000 mg | Freq: Once | INTRAMUSCULAR | Status: AC
  Administered 2022-04-18: 150 mg via INTRAMUSCULAR

## 2022-04-18 NOTE — Progress Notes (Signed)
Kelli Scott here for Depo-Provera  Injection.  Injection administered without complication. Patient will return in 3 months for next injection.  [name] l [name], CMA 04/18/2022  4:35 PM

## 2022-07-09 ENCOUNTER — Encounter: Payer: Self-pay | Admitting: General Practice

## 2022-08-05 ENCOUNTER — Other Ambulatory Visit: Payer: Self-pay

## 2022-08-05 ENCOUNTER — Other Ambulatory Visit (HOSPITAL_BASED_OUTPATIENT_CLINIC_OR_DEPARTMENT_OTHER): Payer: Self-pay

## 2022-08-05 DIAGNOSIS — Z3042 Encounter for surveillance of injectable contraceptive: Secondary | ICD-10-CM

## 2022-08-05 MED ORDER — MEDROXYPROGESTERONE ACETATE 150 MG/ML IM SUSY
150.0000 mg | PREFILLED_SYRINGE | INTRAMUSCULAR | 3 refills | Status: DC
  Filled 2022-08-05: qty 1, 90d supply, fill #0
  Filled 2023-01-23: qty 1, 90d supply, fill #1

## 2022-08-05 NOTE — Progress Notes (Signed)
Patient coming on Wednesday Dec 13,2023 for her annual exam. Refill ordered so patient can pick up prescription. Armandina Stammer RN

## 2022-08-07 ENCOUNTER — Ambulatory Visit (INDEPENDENT_AMBULATORY_CARE_PROVIDER_SITE_OTHER): Payer: Medicaid Other | Admitting: Family Medicine

## 2022-08-07 ENCOUNTER — Other Ambulatory Visit (HOSPITAL_COMMUNITY)
Admission: RE | Admit: 2022-08-07 | Discharge: 2022-08-07 | Disposition: A | Discharge status: Home / Self Care | Payer: Medicaid Other | Source: Ambulatory Visit | Attending: Family Medicine | Admitting: Family Medicine

## 2022-08-07 ENCOUNTER — Encounter: Payer: Self-pay | Admitting: Family Medicine

## 2022-08-07 VITALS — BP 125/89 | HR 86 | Ht 59.0 in | Wt 160.0 lb

## 2022-08-07 DIAGNOSIS — Z01419 Encounter for gynecological examination (general) (routine) without abnormal findings: Secondary | ICD-10-CM | POA: Diagnosis not present

## 2022-08-07 DIAGNOSIS — I10 Essential (primary) hypertension: Secondary | ICD-10-CM

## 2022-08-07 DIAGNOSIS — Z6379 Other stressful life events affecting family and household: Secondary | ICD-10-CM

## 2022-08-07 DIAGNOSIS — Z3042 Encounter for surveillance of injectable contraceptive: Secondary | ICD-10-CM | POA: Diagnosis not present

## 2022-08-07 LAB — POCT URINE PREGNANCY: Preg Test, Ur: NEGATIVE

## 2022-08-07 MED ORDER — MEDROXYPROGESTERONE ACETATE 150 MG/ML IM SUSP
150.0000 mg | Freq: Once | INTRAMUSCULAR | Status: AC
  Administered 2022-08-07: 150 mg via INTRAMUSCULAR

## 2022-08-07 NOTE — Progress Notes (Signed)
ANNUAL EXAM Patient name: Kelli Scott MRN [id number]  Date of birth: 1996/06/25 Chief Complaint:   Annual Exam  History of Present Illness:   Kelli Scott is a 26 y.o.  G10P0101  female  being seen today for a routine annual exam.  Current complaints: desires to restart depo.  Her mother had a significant stroke and is currently in a nursing home. Has a lot of stress surrounding this.  Patient's last menstrual period was 07/26/2022 (exact date).   Upstream - 08/07/22 1106       Pregnancy Intention Screening   Does the patient want to become pregnant in the next year? No    Does the patient's partner want to become pregnant in the next year? No    Would the patient like to discuss contraceptive options today? Yes      Contraception Wrap Up   Current Method Hormonal Injection    End Method Hormonal Injection    Contraception Counseling Provided Yes    How was the end contraceptive method provided? Provided on site             Last pap 2021. Results were: NILM w/ HRHPV negative.  Last mammogram: n/a.      No data to display               No data to display           Review of Systems:   Pertinent items are noted in HPI Denies any headaches, blurred vision, fatigue, shortness of breath, chest pain, abdominal pain, abnormal vaginal discharge/itching/odor/irritation, problems with periods, bowel movements, urination, or intercourse unless otherwise stated above. Pertinent History Reviewed:  Reviewed past medical,surgical, social and family history.  Reviewed problem list, medications and allergies. Physical Assessment:   Vitals:   08/07/22 1043  BP: 125/89  Pulse: 86  Weight: 160 lb (72.6 kg)  Height: 4\' 11"  (1.499 m)  Body mass index is 32.32 kg/m.        Physical Examination:   General appearance - well appearing, and in no distress  Mental status - alert, oriented to person, place, and time  Psych:  She has a normal mood and affect  Skin - warm and  dry, normal color, no suspicious lesions noted  Chest - effort normal, all lung fields clear to auscultation bilaterally  Heart - normal rate and regular rhythm  Neck:  midline trachea, no thyromegaly or nodules  Breasts - breasts appear normal, no suspicious masses, no skin or nipple changes or axillary nodes  Abdomen - soft, nontender, nondistended, no masses or organomegaly  Pelvic - VULVA: normal appearing vulva with no masses, tenderness or lesions  VAGINA: normal appearing vagina with normal color and discharge, no lesions  CERVIX: normal appearing cervix without discharge or lesions, no CMT  Thin prep pap is not done   UTERUS: uterus is felt to be normal size, shape, consistency and nontender   ADNEXA: No adnexal masses or tenderness noted.  Extremities:  No swelling or varicosities noted  Chaperone present for exam  Assessment & Plan:  1. Well woman exam with routine gynecological exam PAP due next year. Desires STI testing.  2. Hypertension, essential BP normal  3. Stress due to illness of family member Referred to counseling.   Labs/procedures today: n/a   No orders of the defined types were placed in this encounter.   Meds: No orders of the defined types were placed in this encounter.   Follow-up: No follow-ups on  file.  Levie Heritage, DO 08/07/2022 11:21 AM

## 2022-08-08 LAB — CERVICOVAGINAL ANCILLARY ONLY
Chlamydia: NEGATIVE
Comment: NEGATIVE
Comment: NEGATIVE
Comment: NORMAL
Neisseria Gonorrhea: NEGATIVE
Trichomonas: NEGATIVE

## 2022-10-31 ENCOUNTER — Ambulatory Visit (INDEPENDENT_AMBULATORY_CARE_PROVIDER_SITE_OTHER): Payer: Medicaid Other

## 2022-10-31 VITALS — BP 140/92 | HR 100

## 2022-10-31 DIAGNOSIS — Z3042 Encounter for surveillance of injectable contraceptive: Secondary | ICD-10-CM

## 2022-10-31 MED ORDER — MEDROXYPROGESTERONE ACETATE 150 MG/ML IM SUSY
150.0000 mg | PREFILLED_SYRINGE | Freq: Once | INTRAMUSCULAR | Status: AC
  Administered 2022-10-31: 150 mg via INTRAMUSCULAR

## 2022-10-31 NOTE — Progress Notes (Signed)
Date last pap: 10/21/19. Last well woman exam 08/07/22 Last Depo-Provera: 08/07/22. Side Effects if any: none. Serum HCG indicated? na. Depo-Provera 150 mg IM given by: Sharlene Dory RN. Next appointment due schedule with front desk in three months.  Kathrene Alu RN

## 2023-01-22 ENCOUNTER — Ambulatory Visit: Payer: Medicaid Other

## 2023-01-23 ENCOUNTER — Ambulatory Visit (INDEPENDENT_AMBULATORY_CARE_PROVIDER_SITE_OTHER): Payer: Medicaid Other

## 2023-01-23 ENCOUNTER — Other Ambulatory Visit (HOSPITAL_BASED_OUTPATIENT_CLINIC_OR_DEPARTMENT_OTHER): Payer: Self-pay

## 2023-01-23 VITALS — BP 123/89 | HR 69 | Wt 144.1 lb

## 2023-01-23 DIAGNOSIS — Z3042 Encounter for surveillance of injectable contraceptive: Secondary | ICD-10-CM

## 2023-01-23 MED ORDER — MEDROXYPROGESTERONE ACETATE 150 MG/ML IM SUSY
150.0000 mg | PREFILLED_SYRINGE | Freq: Once | INTRAMUSCULAR | Status: AC
Start: 2023-01-23 — End: 2023-01-23
  Administered 2023-01-23: 150 mg via INTRAMUSCULAR

## 2023-01-23 NOTE — Progress Notes (Signed)
Kelli Scott here for Depo-Provera  Injection.  Injection administered without complication. Patient will return in 3 months for next injection.  Kelli Scott l Kelli Scott, CMA 01/23/2023  10:40 AM

## 2023-04-17 ENCOUNTER — Other Ambulatory Visit (HOSPITAL_BASED_OUTPATIENT_CLINIC_OR_DEPARTMENT_OTHER): Payer: Self-pay

## 2023-04-17 ENCOUNTER — Other Ambulatory Visit: Payer: Self-pay

## 2023-04-17 DIAGNOSIS — Z3042 Encounter for surveillance of injectable contraceptive: Secondary | ICD-10-CM

## 2023-04-17 MED ORDER — MEDROXYPROGESTERONE ACETATE 150 MG/ML IM SUSY
150.0000 mg | PREFILLED_SYRINGE | INTRAMUSCULAR | 3 refills | Status: DC
Start: 2023-04-17 — End: 2023-10-01
  Filled 2023-04-17: qty 1, 90d supply, fill #0
  Filled 2023-07-08: qty 1, 90d supply, fill #1

## 2023-04-18 ENCOUNTER — Ambulatory Visit (INDEPENDENT_AMBULATORY_CARE_PROVIDER_SITE_OTHER): Payer: Medicaid Other

## 2023-04-18 VITALS — BP 134/90 | HR 75 | Wt 142.0 lb

## 2023-04-18 DIAGNOSIS — Z3042 Encounter for surveillance of injectable contraceptive: Secondary | ICD-10-CM

## 2023-04-18 MED ORDER — MEDROXYPROGESTERONE ACETATE 150 MG/ML IM SUSP
150.0000 mg | Freq: Once | INTRAMUSCULAR | Status: AC
Start: 2023-04-18 — End: 2023-04-18
  Administered 2023-04-18: 150 mg via INTRAMUSCULAR

## 2023-04-18 NOTE — Progress Notes (Addendum)
Kelli Scott here for Depo-Provera  Injection.  Injection administered without complication. Patient will return in 3 months for next injection.  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

## 2023-07-08 ENCOUNTER — Other Ambulatory Visit (HOSPITAL_BASED_OUTPATIENT_CLINIC_OR_DEPARTMENT_OTHER): Payer: Self-pay

## 2023-07-09 ENCOUNTER — Ambulatory Visit: Payer: Medicaid Other | Admitting: Obstetrics & Gynecology

## 2023-07-09 ENCOUNTER — Other Ambulatory Visit (HOSPITAL_COMMUNITY)
Admission: RE | Admit: 2023-07-09 | Discharge: 2023-07-09 | Disposition: A | Discharge status: Home / Self Care | Payer: Medicaid Other | Source: Ambulatory Visit | Attending: Obstetrics & Gynecology | Admitting: Obstetrics & Gynecology

## 2023-07-09 ENCOUNTER — Encounter: Payer: Self-pay | Admitting: Obstetrics & Gynecology

## 2023-07-09 VITALS — BP 139/88 | HR 72 | Ht 59.0 in | Wt 140.0 lb

## 2023-07-09 DIAGNOSIS — Z113 Encounter for screening for infections with a predominantly sexual mode of transmission: Secondary | ICD-10-CM | POA: Insufficient documentation

## 2023-07-09 DIAGNOSIS — Z3042 Encounter for surveillance of injectable contraceptive: Secondary | ICD-10-CM

## 2023-07-09 DIAGNOSIS — Z01419 Encounter for gynecological examination (general) (routine) without abnormal findings: Secondary | ICD-10-CM | POA: Insufficient documentation

## 2023-07-09 MED ORDER — MEDROXYPROGESTERONE ACETATE 150 MG/ML IM SUSY
150.0000 mg | PREFILLED_SYRINGE | Freq: Once | INTRAMUSCULAR | Status: AC
  Administered 2023-07-09: 150 mg via INTRAMUSCULAR

## 2023-07-09 NOTE — Progress Notes (Signed)
Subjective:     Kelli Scott is a 27 y.o. female here for a routine exam.  Current complaints: no complaints. Pt is currently on Depo Provera. No new partners. Desires STI screening.      Gynecologic History No LMP recorded. Patient has had an injection. Contraception: Depo-Provera injections Last Pap: 10/21/2019 Results were: normal Last mammogram: n/a.   Obstetric History OB History  Gravida Para Term Preterm AB Living  1 1   1   1   SAB IAB Ectopic Multiple Live Births        0 1    # Outcome Date GA Lbr Len/2nd Weight Sex Type Anes PTL Lv  1 Preterm 03/18/19 [redacted]w[redacted]d  4 lb 11.1 oz (2.13 kg) M CS-LTranv EPI, Local  LIV   The following portions of the patient's history were reviewed and updated as appropriate: allergies, current medications, past family history, past medical history, past social history, past surgical history, and problem list.  Review of Systems Pertinent items are noted in HPI.    Objective:  BP 139/88   Pulse 72   Ht 4\' 11"  (1.499 m)   Wt 140 lb (63.5 kg)   BMI 28.28 kg/m   General Appearance:    Alert, cooperative, no distress, appears stated age  Head:    Normocephalic, without obvious abnormality, atraumatic  Eyes:    conjunctiva/corneas clear, EOM's intact, both eyes  Ears:    Normal external ear canals, both ears  Nose:   Nares normal, septum midline, mucosa normal, no drainage    or sinus tenderness  Throat:   Lips, mucosa, and tongue normal; teeth and gums normal  Neck:   Supple, symmetrical, trachea midline, no adenopathy;    thyroid:  no enlargement/tenderness/nodules  Back:     Symmetric, no curvature, ROM normal, no CVA tenderness  Lungs:     respirations unlabored  Chest Wall:    No tenderness or deformity   Heart:    Regular rate and rhythm  Breast Exam:    No tenderness, masses, or nipple abnormality  Abdomen:     Soft, non-tender, bowel sounds active all four quadrants,    no masses, no organomegaly  Genitalia:    Normal female without  lesion, discharge or tenderness   No abnormal discharge.   Extremities:   Extremities normal, atraumatic, no cyanosis or edema  Pulses:   2+ and symmetric all extremities  Skin:   Skin color, texture, turgor normal, no rashes or lesions     Assessment:    Healthy female exam.  STI screening    Plan:  Diagnoses and all orders for this visit:  Encounter for surveillance of injectable contraceptive -     medroxyPROGESTERone Acetate SUSY 150 mg  Well woman exam with routine gynecological exam -     Cancel: Cytology - PAP( [place]) -     Cytology - PAP( [place])  Routine screening for STI (sexually transmitted infection) -     Cytology - PAP( [place])   F/u in 1 year   Chrishaun Sasso L. Harraway-Levey, M.D., [place]

## 2023-07-11 LAB — CYTOLOGY - PAP
Chlamydia: NEGATIVE
Comment: NEGATIVE
Comment: NEGATIVE
Comment: NORMAL
Diagnosis: NEGATIVE
Neisseria Gonorrhea: NEGATIVE
Trichomonas: NEGATIVE

## 2023-10-01 ENCOUNTER — Other Ambulatory Visit: Payer: Self-pay

## 2023-10-01 ENCOUNTER — Other Ambulatory Visit (HOSPITAL_BASED_OUTPATIENT_CLINIC_OR_DEPARTMENT_OTHER): Payer: Self-pay

## 2023-10-01 ENCOUNTER — Ambulatory Visit: Payer: Medicaid Other

## 2023-10-01 VITALS — BP 136/96 | HR 77 | Wt 143.0 lb

## 2023-10-01 DIAGNOSIS — Z3042 Encounter for surveillance of injectable contraceptive: Secondary | ICD-10-CM | POA: Diagnosis not present

## 2023-10-01 MED ORDER — MEDROXYPROGESTERONE ACETATE 150 MG/ML IM SUSY
150.0000 mg | PREFILLED_SYRINGE | INTRAMUSCULAR | 3 refills | Status: DC
  Filled 2023-10-01: qty 1, 90d supply, fill #0
  Filled 2023-12-18: qty 1, 90d supply, fill #1
  Filled 2024-03-02: qty 1, 90d supply, fill #2
  Filled 2024-05-18: qty 1, 90d supply, fill #3

## 2023-10-01 MED ORDER — MEDROXYPROGESTERONE ACETATE 150 MG/ML IM SUSP
150.0000 mg | Freq: Once | INTRAMUSCULAR | Status: AC
  Administered 2023-10-01: 150 mg via INTRAMUSCULAR

## 2023-10-01 NOTE — Progress Notes (Signed)
 Date last pap: 07/09/23. Last Depo-Provera : 07/09/23. Side Effects if any: none . Serum HCG indicated? N/A. Depo-Provera  150 mg IM given by: Lafonda Piety, CMA. Next appointment due April 23-Dec 31 2023.

## 2023-12-18 ENCOUNTER — Other Ambulatory Visit (HOSPITAL_BASED_OUTPATIENT_CLINIC_OR_DEPARTMENT_OTHER): Payer: Self-pay

## 2023-12-18 ENCOUNTER — Ambulatory Visit

## 2023-12-18 VITALS — BP 145/99 | HR 76 | Ht 59.0 in | Wt 141.0 lb

## 2023-12-18 DIAGNOSIS — Z3042 Encounter for surveillance of injectable contraceptive: Secondary | ICD-10-CM | POA: Diagnosis not present

## 2023-12-18 DIAGNOSIS — Z3202 Encounter for pregnancy test, result negative: Secondary | ICD-10-CM | POA: Diagnosis not present

## 2023-12-18 LAB — POCT URINE PREGNANCY: Preg Test, Ur: NEGATIVE

## 2023-12-18 MED ORDER — MEDROXYPROGESTERONE ACETATE 150 MG/ML IM SUSY
150.0000 mg | PREFILLED_SYRINGE | Freq: Once | INTRAMUSCULAR | Status: AC
  Administered 2023-12-18: 150 mg via INTRAMUSCULAR

## 2023-12-18 NOTE — Progress Notes (Signed)
 Date last pap: 12/15/2023. Last Depo-Provera : 10/01/2023. Side Effects if any: none. Serum HCG indicated? neg. Depo-Provera  150 mg IM given by: [facility] RN. Next appointment due 03/04/2024-03/18/2024.

## 2023-12-22 ENCOUNTER — Ambulatory Visit: Payer: Medicaid Other

## 2024-03-02 ENCOUNTER — Other Ambulatory Visit (HOSPITAL_BASED_OUTPATIENT_CLINIC_OR_DEPARTMENT_OTHER): Payer: Self-pay

## 2024-03-02 ENCOUNTER — Other Ambulatory Visit: Payer: Self-pay

## 2024-03-04 ENCOUNTER — Ambulatory Visit

## 2024-03-04 VITALS — BP 133/87 | HR 100 | Ht 59.0 in | Wt 144.0 lb

## 2024-03-04 DIAGNOSIS — Z3042 Encounter for surveillance of injectable contraceptive: Secondary | ICD-10-CM | POA: Diagnosis not present

## 2024-03-04 LAB — POCT URINE PREGNANCY: Preg Test, Ur: NEGATIVE

## 2024-03-04 MED ORDER — MEDROXYPROGESTERONE ACETATE 150 MG/ML IM SUSY
150.0000 mg | PREFILLED_SYRINGE | Freq: Once | INTRAMUSCULAR | Status: AC
  Administered 2024-03-04: 150 mg via INTRAMUSCULAR

## 2024-03-04 NOTE — Progress Notes (Signed)
 Date last pap: 07/09/2023. Last Depo-Provera : 12/18/23. Side Effects if any: NA. Serum HCG indicated? NA. Depo-Provera  150 mg IM given by: [facility] RN. Next appointment due 05/20/2024-06/03/24.

## 2024-05-18 ENCOUNTER — Other Ambulatory Visit (HOSPITAL_BASED_OUTPATIENT_CLINIC_OR_DEPARTMENT_OTHER): Payer: Self-pay

## 2024-05-21 ENCOUNTER — Ambulatory Visit

## 2024-05-21 VITALS — BP 128/84 | HR 72 | Ht 60.0 in

## 2024-05-21 DIAGNOSIS — Z3042 Encounter for surveillance of injectable contraceptive: Secondary | ICD-10-CM | POA: Diagnosis not present

## 2024-05-21 MED ORDER — MEDROXYPROGESTERONE ACETATE 150 MG/ML IM SUSP
150.0000 mg | Freq: Once | INTRAMUSCULAR | Status: AC
  Administered 2024-05-21: 150 mg via INTRAMUSCULAR

## 2024-05-21 NOTE — Progress Notes (Signed)
 Date last pap: 07/09/23 Last Depo-Provera : 03/04/2024 Side Effects if any: none. Serum HCG indicated? No- patient started period today. Depo-Provera  150 mg IM given by: J.HowardRN. Next appointment due in 3months with her annual exam. Patient scheduled before leaving clinic.

## 2024-05-26 DIAGNOSIS — Z3042 Encounter for surveillance of injectable contraceptive: Secondary | ICD-10-CM | POA: Insufficient documentation

## 2024-07-14 ENCOUNTER — Ambulatory Visit (INDEPENDENT_AMBULATORY_CARE_PROVIDER_SITE_OTHER): Admitting: Family Medicine

## 2024-07-14 ENCOUNTER — Other Ambulatory Visit (HOSPITAL_COMMUNITY)
Admission: RE | Admit: 2024-07-14 | Discharge: 2024-07-14 | Disposition: A | Discharge status: Home / Self Care | Source: Ambulatory Visit | Attending: Family Medicine | Admitting: Family Medicine

## 2024-07-14 VITALS — BP 126/89 | HR 79 | Wt 149.0 lb

## 2024-07-14 DIAGNOSIS — Z113 Encounter for screening for infections with a predominantly sexual mode of transmission: Secondary | ICD-10-CM

## 2024-07-14 DIAGNOSIS — Z01419 Encounter for gynecological examination (general) (routine) without abnormal findings: Secondary | ICD-10-CM | POA: Diagnosis not present

## 2024-07-14 NOTE — Progress Notes (Signed)
 Patient presents for Annual.  LMP: No LMP recorded. Patient has had an injection.  Last pap: Date: 07/09/2023 Contraception: Depo Mammogram: Not yet indicated STD Screening: Accepts

## 2024-07-15 LAB — CERVICOVAGINAL ANCILLARY ONLY
Chlamydia: NEGATIVE
Comment: NEGATIVE
Comment: NEGATIVE
Comment: NORMAL
Neisseria Gonorrhea: NEGATIVE
Trichomonas: NEGATIVE

## 2024-07-16 ENCOUNTER — Ambulatory Visit: Payer: Self-pay | Admitting: Family Medicine

## 2024-07-19 NOTE — Progress Notes (Signed)
   ANNUAL EXAM Patient name: Kelli Scott MRN [id number]  Date of birth: Apr 03, 1996 Chief Complaint:   Annual Exam  History of Present Illness:   Kelli Scott is a 28 y.o.  G4P0101  female  being seen today for a routine annual exam.  Current complaints: none  No LMP recorded. Patient has had an injection.    Last pap 06/2023. Results were: NILM w/ HRHPV not done. H/O abnormal pap: no Last mammogram: none     07/14/2024   11:19 AM  Depression screen PHQ 2/9  Decreased Interest 0  Down, Depressed, Hopeless 3  PHQ - 2 Score 3  Altered sleeping 1  Tired, decreased energy 0  Change in appetite 0  Feeling bad or failure about yourself  1  Trouble concentrating 0  Moving slowly or fidgety/restless 0  Suicidal thoughts 0  PHQ-9 Score 5        07/14/2024   11:19 AM  GAD 7 : Generalized Anxiety Score  Nervous, Anxious, on Edge 3  Control/stop worrying 1  Worry too much - different things 1  Trouble relaxing 0  Restless 0  Easily annoyed or irritable 0  Afraid - awful might happen 1  Total GAD 7 Score 6     Review of Systems:   Pertinent items are noted in HPI Denies any headaches, blurred vision, fatigue, shortness of breath, chest pain, abdominal pain, abnormal vaginal discharge/itching/odor/irritation, problems with periods, bowel movements, urination, or intercourse unless otherwise stated above. Pertinent History Reviewed:  Reviewed past medical,surgical, social and family history.  Reviewed problem list, medications and allergies. Physical Assessment:   Vitals:   07/14/24 1114  BP: 126/89  Pulse: 79  Weight: 149 lb (67.6 kg)  Body mass index is 29.1 kg/m.        Physical Examination:   General appearance - well appearing, and in no distress  Mental status - alert, oriented to person, place, and time  Psych:  She has a normal mood and affect  Skin - warm and dry, normal color, no suspicious lesions noted  Chest - effort normal, all lung fields clear to  auscultation bilaterally  Heart - normal rate and regular rhythm  Neck:  midline trachea, no thyromegaly or nodules  Breasts - breasts appear normal, no suspicious masses, no skin or nipple changes or axillary nodes  Abdomen - soft, nontender, nondistended, no masses or organomegaly  Pelvic - VULVA: normal appearing vulva with no masses, tenderness or lesions  VAGINA: normal appearing vagina with normal color and discharge, no lesions  CERVIX: normal appearing cervix without discharge or lesions, no CMT  Thin prep pap is not done     Extremities:  No swelling or varicosities noted  Chaperone present for exam  Assessment & Plan:  1. Well woman exam with routine gynecological exam  2. Routine screening for STI (sexually transmitted infection) (Primary) - RPR+HBsAg+HCVAb+... - Cervicovaginal ancillary only( [place])     Orders Placed This Encounter  Procedures   RPR+HBsAg+HCVAb+...    Meds: No orders of the defined types were placed in this encounter.   Follow-up: No follow-ups on file.  Malisha Mabey J Schneur Crowson, DO 07/19/2024 2:38 PM

## 2024-08-05 ENCOUNTER — Other Ambulatory Visit (HOSPITAL_BASED_OUTPATIENT_CLINIC_OR_DEPARTMENT_OTHER): Payer: Self-pay

## 2024-08-05 ENCOUNTER — Other Ambulatory Visit: Payer: Self-pay

## 2024-08-05 DIAGNOSIS — Z3040 Encounter for surveillance of contraceptives, unspecified: Secondary | ICD-10-CM

## 2024-08-05 MED ORDER — MEDROXYPROGESTERONE ACETATE 150 MG/ML IM SUSY
PREFILLED_SYRINGE | INTRAMUSCULAR | 3 refills | Status: AC
  Filled 2024-08-05: qty 1, 90d supply, fill #0

## 2024-08-05 NOTE — Progress Notes (Signed)
 Patient called requesting a refill of Depo Provera . Depo Provera  150 mg inject 1 ml into the muscle every 3 months was sent to her pharmacy.  Kinnie Kaupp l Eara Burruel, CMA

## 2024-08-06 ENCOUNTER — Ambulatory Visit

## 2024-08-06 VITALS — BP 132/86 | HR 74 | Ht 59.0 in | Wt 158.0 lb

## 2024-08-06 DIAGNOSIS — Z3042 Encounter for surveillance of injectable contraceptive: Secondary | ICD-10-CM

## 2024-08-06 MED ORDER — MEDROXYPROGESTERONE ACETATE 150 MG/ML IM SUSP
150.0000 mg | Freq: Once | INTRAMUSCULAR | Status: AC
  Administered 2024-08-06: 150 mg via INTRAMUSCULAR

## 2024-08-06 NOTE — Progress Notes (Signed)
 Date last pap: 07/09/2023. Last Depo-Provera : 08/06/2024. Side Effects if any: . Serum HCG indicated? . Depo-Provera  150 mg IM given by: Shawnee Fleet, CMA . Next appointment due will call to schedule range is 10/23/2023-11/06/2023.

## 2024-08-07 LAB — RPR+HBSAG+HCVAB+...
HIV Screen 4th Generation wRfx: NONREACTIVE
Hep C Virus Ab: NONREACTIVE
Hepatitis B Surface Ag: NEGATIVE
RPR Ser Ql: NONREACTIVE

## 2024-10-25 ENCOUNTER — Ambulatory Visit
